# Patient Record
Sex: Female | Born: 1980 | Race: Black or African American | Hispanic: No | Smoking: Never smoker
Health system: Southern US, Community
[De-identification: ages and names within clinical notes are randomized; demographics above are authoritative.]

## PROBLEM LIST (undated history)

## (undated) DIAGNOSIS — E119 Type 2 diabetes mellitus without complications: Secondary | ICD-10-CM

## (undated) DIAGNOSIS — F419 Anxiety disorder, unspecified: Secondary | ICD-10-CM

## (undated) DIAGNOSIS — I10 Essential (primary) hypertension: Secondary | ICD-10-CM

## (undated) DIAGNOSIS — Z973 Presence of spectacles and contact lenses: Secondary | ICD-10-CM

## (undated) DIAGNOSIS — N920 Excessive and frequent menstruation with regular cycle: Secondary | ICD-10-CM

## (undated) HISTORY — DX: Essential (primary) hypertension: I10

## (undated) HISTORY — DX: Anxiety disorder, unspecified: F41.9

## (undated) HISTORY — PX: OTHER SURGICAL HISTORY: SHX169

---

## 2003-10-15 ENCOUNTER — Ambulatory Visit (HOSPITAL_COMMUNITY): Admission: RE | Admit: 2003-10-15 | Discharge: 2003-10-15 | Payer: Self-pay | Admitting: Family Medicine

## 2003-11-12 ENCOUNTER — Emergency Department (HOSPITAL_COMMUNITY): Admission: EM | Admit: 2003-11-12 | Discharge: 2003-11-12 | Payer: Self-pay | Admitting: Emergency Medicine

## 2005-05-03 ENCOUNTER — Emergency Department (HOSPITAL_COMMUNITY): Admission: EM | Admit: 2005-05-03 | Discharge: 2005-05-03 | Payer: Self-pay | Admitting: Emergency Medicine

## 2005-05-06 ENCOUNTER — Ambulatory Visit (HOSPITAL_COMMUNITY): Admission: RE | Admit: 2005-05-06 | Discharge: 2005-05-06 | Payer: Self-pay | Admitting: Orthopedic Surgery

## 2005-05-08 ENCOUNTER — Encounter: Admission: RE | Admit: 2005-05-08 | Discharge: 2005-06-06 | Payer: Self-pay | Admitting: Orthopedic Surgery

## 2005-07-30 ENCOUNTER — Ambulatory Visit (HOSPITAL_BASED_OUTPATIENT_CLINIC_OR_DEPARTMENT_OTHER): Admission: RE | Admit: 2005-07-30 | Discharge: 2005-07-31 | Payer: Self-pay | Admitting: Orthopedic Surgery

## 2007-11-18 ENCOUNTER — Emergency Department (HOSPITAL_COMMUNITY): Admission: EM | Admit: 2007-11-18 | Discharge: 2007-11-18 | Payer: Self-pay | Admitting: Family Medicine

## 2008-02-13 ENCOUNTER — Emergency Department (HOSPITAL_COMMUNITY): Admission: EM | Admit: 2008-02-13 | Discharge: 2008-02-13 | Payer: Self-pay | Admitting: Emergency Medicine

## 2010-05-28 LAB — DIFFERENTIAL
Basophils Absolute: 0.1 10*3/uL (ref 0.0–0.1)
Basophils Relative: 1 % (ref 0–1)
Eosinophils Absolute: 0.3 10*3/uL (ref 0.0–0.7)
Eosinophils Relative: 5 % (ref 0–5)
Lymphocytes Relative: 47 % — ABNORMAL HIGH (ref 12–46)
Lymphs Abs: 2.6 10*3/uL (ref 0.7–4.0)
Monocytes Absolute: 0.3 10*3/uL (ref 0.1–1.0)
Monocytes Relative: 6 % (ref 3–12)
Neutro Abs: 2.3 10*3/uL (ref 1.7–7.7)
Neutrophils Relative %: 41 % — ABNORMAL LOW (ref 43–77)

## 2010-05-28 LAB — URINALYSIS, ROUTINE W REFLEX MICROSCOPIC
Glucose, UA: NEGATIVE mg/dL
Hgb urine dipstick: NEGATIVE
Ketones, ur: 15 mg/dL — AB
Nitrite: NEGATIVE
Protein, ur: NEGATIVE mg/dL
Specific Gravity, Urine: 1.03 (ref 1.005–1.030)
Urobilinogen, UA: 1 mg/dL (ref 0.0–1.0)
pH: 6 (ref 5.0–8.0)

## 2010-05-28 LAB — CBC
HCT: 41 % (ref 36.0–46.0)
Hemoglobin: 13.2 g/dL (ref 12.0–15.0)
MCHC: 32.2 g/dL (ref 30.0–36.0)
MCV: 81.9 fL (ref 78.0–100.0)
Platelets: 272 10*3/uL (ref 150–400)
RBC: 5.01 MIL/uL (ref 3.87–5.11)
RDW: 13.9 % (ref 11.5–15.5)
WBC: 5.6 10*3/uL (ref 4.0–10.5)

## 2010-05-28 LAB — WET PREP, GENITAL
Clue Cells Wet Prep HPF POC: NONE SEEN
Trich, Wet Prep: NONE SEEN
WBC, Wet Prep HPF POC: NONE SEEN
Yeast Wet Prep HPF POC: NONE SEEN

## 2010-05-28 LAB — GC/CHLAMYDIA PROBE AMP, GENITAL
Chlamydia, DNA Probe: NEGATIVE
GC Probe Amp, Genital: NEGATIVE

## 2010-05-28 LAB — HCG, QUANTITATIVE, PREGNANCY: hCG, Beta Chain, Quant, S: <2 m[IU]/mL (ref <5)

## 2010-05-28 LAB — POCT PREGNANCY, URINE: Preg Test, Ur: NEGATIVE

## 2010-05-28 LAB — RPR: RPR Ser Ql: NONREACTIVE

## 2010-06-27 HISTORY — PX: KNEE ARTHROSCOPY W/ ACL RECONSTRUCTION: SHX1858

## 2010-06-29 NOTE — Op Note (Signed)
NAMEFLARA, STORTI                 ACCOUNT NO.:  [id number]   MEDICAL RECORD NO.:  [id number]          PATIENT TYPE:  AMB   LOCATION:  DSC                          FACILITY:  [facility]   PHYSICIAN:  Deidre Ala, M.D.    DATE OF BIRTH:  1980-03-29   DATE OF PROCEDURE:  07/30/2005  DATE OF DISCHARGE:                                 OPERATIVE REPORT   PREOPERATIVE DIAGNOSIS:  Right knee anterior cruciate ligament tear,  complete, rule out meniscal tear.   POSTOPERATIVE DIAGNOSES:  1.  Right knee complete anterior cruciate ligament tear.  2.  Right posterior medial meniscal tear, bucket handle.   OPERATION:  1.  Right knee operative arthroscopy with endoscopically assisted anterior      cruciate ligament reconstruction using allograft/patellar tendon bone      with internal fixation.  2.  Posterior medial meniscectomy.  3.  Use of intraoperative flora.   SURGEON:  1.  Charlesetta Shanks, M.D.   ASSISTANT:  Clarene Reamer, P.A.-C.   ANESTHESIA:  General with LMA.   CULTURES:  None.   DRAINS:  None.   ESTIMATED BLOOD LOSS:  Minimal.   TOURNIQUET TIME:  Sixty minutes.   PATHOLOGIC FINDINGS/HISTORY:  Ladene was sent by [facility].  She had seen  Dr. Althea Charon before with ACL tear, complete, probable posterior medial  meniscus tear.  She elected to not have anything done, but she reinjured her  knee on May 06, 2005.  A new MRI scan showed a questionable oblique  posterior horn lateral meniscus tear and an ACL completely torn.  At this  time, she had positive signs and elected to proceed with surgical  intervention.  At surgery, she did have a complete ACL tear.  There was a  posterior medial meniscus tear, bucket handle type, that we debrided back to  a stable rim.  The lateral meniscus appeared stable to [place], perhaps a  slightly enlarged popliteal hiatus.  The joint surfaces looked good. There  were plicas.  At closure, we had used patellar tendon allograft with a 15 mm  tendon strip  tubed down to a 10 with 10 mm x 30 plugs, proximal and distal.  We used a 9 x 30 Kurosaka screw proximal and a 9 x 25 distal with excellent  isometry and tight Lachman and anterior drawer at closure with clearance of  the notch in full extension.   PROCEDURE:  With adequate anesthesia obtained using LMA technique, 1 gm of  Ancef given IV prophylaxis, the patient was placed in a supine position.  The right lower extremity was prepped from the malleoli to the tourniquet in  a standard fashion.  The patient was then placed in a __________ knee holder  after a standard prepping and draping.  A superolateral inflow portal was  made.  The knee was insufflated with normal saline with the arthroscopic  pump.  Medial and lateral scope portals were then made, and the joint was  thoroughly inspected.  We then shaved out the medial plica back to the side  wall and lysed the medial band.  I then probed the medial meniscus, found  the tear, used meniscus scissors to cut the front and back of the  displaceable tear, and then saucerized it back to a stable rim with shaver  and basket.  Ablator was used to smooth.  Portals reversed.  I checked the  posterior lateral meniscus, which was intact.  The ACL was then shaved out  its remnant.  I then did a notchplasty with shaver, 4.0 burr, and ablator.  The [employer] guide was then placed on 55 with an incision just medial to the  tubercle.  I then placed the guidepin up just anterior to the posterior  cruciate, which was intact.  I then placed the knee in about -35 degrees of  full extension, and using the C-arm, placed the femoral guide and placed the  guidepin back against the posterior cortex of the distal femur.  We had  reamed 10 mm on the tibial side.  We reamed 10 mm on the femoral side in 35  deep.  At the back table, Clarene Reamer had prepared the graft, as above.  We then placed the two pin passer up and out the distal thigh along with an  incision  made just inferior to the inferior pole of the patella with the  Kurosaka screw guide pin at 12 o'clock and up and out.  We then passed the  graft up into the femoral tunnel until it was snug at the appropriate mark  line with the cortical side up anterior and the cancellous side posterior.  We then under fluoroscopy flexed the knee and straightened out the Kurosaka  guide pin and with the graft protector in place, the 9 x 30 Kurosaka screw  with a squeaky tight fit on top of the graft on the femur.  We then, with  the knee in 45 degrees of flexion, with posterior drawer and pulling  distally on the graft, placed the guidepin anteriorly and placed the 9 x 25  mm Kurosaka screw with squeaky tight fit.  The passing sutures and the wire  distally on the graft were removed as well as the guidepin.  C-arm  fluoroscopy confirmed position on AP and lateral view.  The wounds were  irrigated and closed with 2-0 and 3-0 Vicryl on the subcu with running 4-0  Monocryl and Steri-Strips.  The portals were left open.  A bulky sterile  compressive dressing was applied with knee immobilizer.  The patient, having  tolerated the procedure well, was awakened and taken to the recovery room in  a satisfactory condition.  He will be discharged to outpatient routine.  Crutches and weightbearing as tolerated.  He will be admitted if he has need  for increased pain control but otherwise will go home with CPM.  He will  come back in 3-4 days for postoperative stent, given Percocet for pain.           ______________________________  V. Charlesetta Shanks, M.D.     VEP/MEDQ  D:  07/30/2005  T:  07/30/2005  Job:  [id number]

## 2010-11-12 LAB — POCT PREGNANCY, URINE: Preg Test, Ur: NEGATIVE

## 2011-11-08 ENCOUNTER — Emergency Department (HOSPITAL_COMMUNITY): Payer: Self-pay

## 2011-11-08 ENCOUNTER — Encounter (HOSPITAL_COMMUNITY): Payer: Self-pay

## 2011-11-08 ENCOUNTER — Emergency Department (HOSPITAL_COMMUNITY)
Admission: EM | Admit: 2011-11-08 | Discharge: 2011-11-08 | Disposition: A | Discharge status: Home / Self Care | Payer: Self-pay | Attending: Emergency Medicine | Admitting: Emergency Medicine

## 2011-11-08 DIAGNOSIS — I10 Essential (primary) hypertension: Secondary | ICD-10-CM | POA: Insufficient documentation

## 2011-11-08 DIAGNOSIS — R079 Chest pain, unspecified: Secondary | ICD-10-CM | POA: Insufficient documentation

## 2011-11-08 DIAGNOSIS — R0602 Shortness of breath: Secondary | ICD-10-CM | POA: Insufficient documentation

## 2011-11-08 LAB — COMPREHENSIVE METABOLIC PANEL
ALT: 19 U/L (ref 0–35)
AST: 23 U/L (ref 0–37)
Albumin: 4.2 g/dL (ref 3.5–5.2)
Alkaline Phosphatase: 54 U/L (ref 39–117)
BUN: 10 mg/dL (ref 6–23)
CO2: 28 mEq/L (ref 19–32)
Calcium: 9.9 mg/dL (ref 8.4–10.5)
Chloride: 101 mEq/L (ref 96–112)
Creatinine, Ser: 0.94 mg/dL (ref 0.50–1.10)
GFR calc Af Amer: >90 mL/min (ref >90)
GFR calc non Af Amer: 82 mL/min — ABNORMAL LOW (ref >90)
Glucose, Bld: 102 mg/dL — ABNORMAL HIGH (ref 70–99)
Potassium: 3.4 mEq/L — ABNORMAL LOW (ref 3.5–5.1)
Sodium: 140 mEq/L (ref 135–145)
Total Bilirubin: 0.2 mg/dL — ABNORMAL LOW (ref 0.3–1.2)
Total Protein: 7.6 g/dL (ref 6.0–8.3)

## 2011-11-08 LAB — CBC WITH DIFFERENTIAL/PLATELET
Basophils Absolute: 0 10*3/uL (ref 0.0–0.1)
Basophils Relative: 1 % (ref 0–1)
Eosinophils Absolute: 0.3 10*3/uL (ref 0.0–0.7)
Eosinophils Relative: 5 % (ref 0–5)
HCT: 40.7 % (ref 36.0–46.0)
Hemoglobin: 13.4 g/dL (ref 12.0–15.0)
Lymphocytes Relative: 52 % — ABNORMAL HIGH (ref 12–46)
Lymphs Abs: 3.2 10*3/uL (ref 0.7–4.0)
MCH: 25.9 pg — ABNORMAL LOW (ref 26.0–34.0)
MCHC: 32.9 g/dL (ref 30.0–36.0)
MCV: 78.6 fL (ref 78.0–100.0)
Monocytes Absolute: 0.5 10*3/uL (ref 0.1–1.0)
Monocytes Relative: 8 % (ref 3–12)
Neutro Abs: 2.1 10*3/uL (ref 1.7–7.7)
Neutrophils Relative %: 34 % — ABNORMAL LOW (ref 43–77)
Platelets: 249 10*3/uL (ref 150–400)
RBC: 5.18 MIL/uL — ABNORMAL HIGH (ref 3.87–5.11)
RDW: 13.7 % (ref 11.5–15.5)
WBC: 6.2 10*3/uL (ref 4.0–10.5)

## 2011-11-08 LAB — TROPONIN I: Troponin I: <0.3 ng/mL (ref <0.30)

## 2011-11-08 LAB — POCT PREGNANCY, URINE: Preg Test, Ur: NEGATIVE

## 2011-11-08 MED ORDER — POTASSIUM CHLORIDE CRYS ER 20 MEQ PO TBCR
40.0000 meq | EXTENDED_RELEASE_TABLET | Freq: Once | ORAL | Status: AC
  Administered 2011-11-08: 40 meq via ORAL
  Filled 2011-11-08: qty 2

## 2011-11-08 MED ORDER — LISINOPRIL 10 MG PO TABS: 10.0000 mg | ORAL_TABLET | Freq: Every day | ORAL | Status: DC

## 2011-11-08 MED ORDER — SODIUM CHLORIDE 0.9 % IV BOLUS (SEPSIS)
1000.0000 mL | Freq: Once | INTRAVENOUS | Status: AC
  Administered 2011-11-08: 1000 mL via INTRAVENOUS

## 2011-11-08 MED ORDER — LISINOPRIL 10 MG PO TABS
10.0000 mg | ORAL_TABLET | Freq: Once | ORAL | Status: AC
  Administered 2011-11-08: 10 mg via ORAL
  Filled 2011-11-08: qty 1

## 2011-11-08 NOTE — ED Notes (Signed)
Returned from xray

## 2011-11-08 NOTE — ED Provider Notes (Signed)
History     CSN: [id number]  Arrival date & time 11/08/11  0030   First MD Initiated Contact with Patient 11/08/11 0050      Chief Complaint  Patient presents with  . Chest Pain   HPI  History provided by the patient. Patient is a 31 year old female with no significant PMH who presents with complaints of shortness of breath and chest pressure. Patient states that symptoms first began earlier today around 1 PM with increase shortness of breath and fatigue with exertion while at work. 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 Patient also reports with developing a chest heaviness and pressure. She also has some burning sensation. Symptoms did seem slightly worse after eating a meal late tonight around 8 PM. Currently patient still reports some chest discomfort. She denies any shortness of breath at rest. Patient denies any recent long travel, recent surgery, prior history of cancer, hx of DVT or PE, estrogen use or hemoptysis.    History reviewed. No pertinent past medical history.  Past Surgical History  Procedure Date  . Right acl reconstruction   . Cesarean section     No family history on file.  History  Substance Use Topics  . Smoking status: Never Smoker   . Smokeless tobacco: Not on file  . Alcohol Use: No    OB History    Grav Para Term Preterm Abortions TAB SAB Ect Mult Living                  Review of Systems  Constitutional: Negative for fever and chills.  Eyes: Negative for visual disturbance.  Respiratory: Positive for shortness of breath. Negative for cough.   Cardiovascular: Positive for chest pain. Negative for palpitations and leg swelling.  Gastrointestinal: Negative for nausea, vomiting, abdominal pain, diarrhea and constipation.  Genitourinary: Negative for dysuria, frequency, hematuria and flank pain.  Skin: Negative for rash.  Neurological: Negative for dizziness, syncope,  light-headedness and headaches.    Allergies  Review of patient's allergies indicates no known allergies.  Home Medications  No current outpatient prescriptions on file.  BP 171/109  Pulse 77  Temp 98.1 F (36.7 C)  Resp 17  SpO2 100%  LMP 09/07/2011  Physical Exam  Nursing note and vitals reviewed. Constitutional: She is oriented to person, place, and time. She appears well-developed and well-nourished. No distress.  HENT:  Head: Normocephalic and atraumatic.  Cardiovascular: Normal rate and regular rhythm.   No murmur heard. Pulmonary/Chest: Effort normal and breath sounds normal. No respiratory distress. She has no wheezes. She has no rales.  Abdominal: Soft. There is no tenderness. There is no rebound and no guarding.       No CVA tenderness  Musculoskeletal: Normal range of motion. She exhibits no edema and no tenderness.       No clinical signs for DVT  Neurological: She is alert and oriented to person, place, and time.  Skin: Skin is warm and dry.  Psychiatric: She has a normal mood and affect. Her behavior is normal.    ED Course  Procedures   Results for orders placed during the hospital encounter of 11/08/11  CBC WITH DIFFERENTIAL      Component Value Range   WBC 6.2  4.0 - 10.5 K/uL   RBC 5.18 (*) 3.87 - 5.11 MIL/uL   Hemoglobin 13.4  12.0 - 15.0 g/dL   HCT 78.2  95.6 - 21.3 %  MCV 78.6  78.0 - 100.0 fL   MCH 25.9 (*) 26.0 - 34.0 pg   MCHC 32.9  30.0 - 36.0 g/dL   RDW 82.9  56.2 - 13.0 %   Platelets 249  150 - 400 K/uL   Neutrophils Relative 34 (*) 43 - 77 %   Neutro Abs 2.1  1.7 - 7.7 K/uL   Lymphocytes Relative 52 (*) 12 - 46 %   Lymphs Abs 3.2  0.7 - 4.0 K/uL   Monocytes Relative 8  3 - 12 %   Monocytes Absolute 0.5  0.1 - 1.0 K/uL   Eosinophils Relative 5  0 - 5 %   Eosinophils Absolute 0.3  0.0 - 0.7 K/uL   Basophils Relative 1  0 - 1 %   Basophils Absolute 0.0  0.0 - 0.1 K/uL  COMPREHENSIVE METABOLIC PANEL      Component Value Range    Sodium 140  135 - 145 mEq/L   Potassium 3.4 (*) 3.5 - 5.1 mEq/L   Chloride 101  96 - 112 mEq/L   CO2 28  19 - 32 mEq/L   Glucose, Bld 102 (*) 70 - 99 mg/dL   BUN 10  6 - 23 mg/dL   Creatinine, Ser 8.65  0.50 - 1.10 mg/dL   Calcium 9.9  8.4 - 78.4 mg/dL   Total Protein 7.6  6.0 - 8.3 g/dL   Albumin 4.2  3.5 - 5.2 g/dL   AST 23  0 - 37 U/L   ALT 19  0 - 35 U/L   Alkaline Phosphatase 54  39 - 117 U/L   Total Bilirubin 0.2 (*) 0.3 - 1.2 mg/dL   GFR calc non Af Amer 82 (*) >90 mL/min   GFR calc Af Amer >90  >90 mL/min  TROPONIN I      Component Value Range   Troponin I <0.30  <0.30 ng/mL  POCT PREGNANCY, URINE      Component Value Range   Preg Test, Ur NEGATIVE  NEGATIVE      Dg Chest 2 View  11/08/2011  *RADIOLOGY REPORT*  Clinical Data: Chest pain.  CHEST - 2 VIEW  Comparison: None.  Findings:  Cardiopericardial silhouette within normal limits. Mediastinal contours normal. Trachea midline.  No airspace disease or effusion. Monitoring leads are projected over the chest.  IMPRESSION: Negative two-view chest.   Original Report Authenticated By: Andreas Newport, M.D.      1. Hypertension   2. Shortness of breath       MDM  1:00AM patient seen and evaluated. Patient in no acute distress and appears well. Recheck of blood pressure performed by me showed 197/133 in right arm with regular cuff.   Patient discussed with physician. Patient no significant past medical history. She is PERC negative. Patient has unremarkable ECG. No signs of LVH. Chest x-ray also unremarkable with no signs of enlarged heart. He should also with negative troponin. Labs are unremarkable. At this time do not suspect any hypertensive crisis or urgency. Patient has not seemed to have any endorgan damage.  I discussed findings with patient. Will start patient on low-dose lisinopril to be followed with PCP next week. Patient agrees and will begin checking blood pressures regularly.       Date: 11/08/2011   Rate: 80  Rhythm: normal sinus rhythm  QRS Axis: normal  Intervals: normal  ST/T Wave abnormalities: normal  Conduction Disutrbances:none  Narrative Interpretation:   Old EKG Reviewed: none available  Angus Seller, [place] 11/08/11 [phone]

## 2011-11-08 NOTE — ED Provider Notes (Signed)
Medical screening examination/treatment/procedure(s) were performed by non-physician practitioner and as supervising physician I was immediately available for consultation/collaboration.  Shaunice Levitan, MD 11/08/11 0801 

## 2011-11-08 NOTE — ED Notes (Signed)
Pt states she has been having burning and pressure to her upper chest since she was at work Thursday at 1300.  States she did not eat all day and then she ate pepperoni and cheese on a bagel at 2000 last night.  States she felt some shortness of breath earlier in the day on Thursday.  Pt denies hx HTN-states family hx HTN.

## 2013-05-22 ENCOUNTER — Emergency Department (HOSPITAL_COMMUNITY)
Admission: EM | Admit: 2013-05-22 | Discharge: 2013-05-22 | Disposition: A | Discharge status: Home / Self Care | Payer: BC Managed Care – PPO | Source: Home / Self Care | Attending: Family Medicine | Admitting: Family Medicine

## 2013-05-22 ENCOUNTER — Encounter (HOSPITAL_COMMUNITY): Payer: Self-pay | Admitting: Emergency Medicine

## 2013-05-22 DIAGNOSIS — M7050 Other bursitis of knee, unspecified knee: Secondary | ICD-10-CM

## 2013-05-22 DIAGNOSIS — IMO0002 Reserved for concepts with insufficient information to code with codable children: Secondary | ICD-10-CM

## 2013-05-22 MED ORDER — PREDNISONE 10 MG PO KIT: PACK | ORAL | Status: DC

## 2013-05-22 NOTE — ED Provider Notes (Signed)
[name] is a 33 y.o. female who presents to Urgent Care today for right knee pain. Patient has had worsening right medial knee pain for the past several days. She denies any recent injury. She has increased her exercises recently. Pain is worse with activity better with rest. She's tried ibuprofen which has helped some. No radiating pain weakness or numbness.   History reviewed. No pertinent past medical history. History  Substance Use Topics  . Smoking status: Never Smoker   . Smokeless tobacco: Not on file  . Alcohol Use: No   ROS as above Medications: No current facility-administered medications for this encounter.   Current Outpatient Prescriptions  Medication Sig Dispense Refill  . aspirin 81 MG chewable tablet Chew 81 mg by mouth once.      Marland Kitchen lisinopril (PRINIVIL,ZESTRIL) 10 MG tablet Take 1 tablet (10 mg total) by mouth daily.  20 tablet  0  . PredniSONE 10 MG KIT 12 day dose pack po  1 kit  0    Exam:  BP 153/107  Pulse 67  Temp(Src) 98.3 F (36.8 C) (Oral)  Resp 16  SpO2 100% Gen: Well NAD Right knee: No effusion noted Full range of motion Tender palpation pes anserine bursa and medial joint line.  Stable edematous exam Positive medial McMurray's test Capillary refill sensation and capillary refill are intact bilateral lower extremities.  Limited musculoskeletal ultrasound of the right knee.  Quad tendon is intact with mild effusion present. Patella tendon is intact with slight hypoechoic change in the mid tendon substance consistent with patellar tendinitis. No increased vascular activity.  Medial joint line is normal-appearing no tears meniscus or visible.  Lateral joint line is normal-appearing.  Hypoechoic fluid seen underneath the tendon structures and the location of the pes anserine bursa. Tender to touch. Consistent with pes anserine bursitis  No results found for this or any previous visit (from the past 24 hour(s)). No results found.  Assessment  and Plan: 33 y.o. female with pes anserine bursitis. Possible medial meniscus tear. Plan to treat with prednisone and home exercise program.  Followup with sports medicine if not improving.  Discussed warning signs or symptoms. Please see discharge instructions. Patient expresses understanding.    Gregor Hams, MD 05/22/13 2015

## 2013-05-22 NOTE — ED Notes (Signed)
Pain  And  Swelling  Of the  r  Knee          X  2  Days   denys  Recent  Injury  But  Has  Had  Problems   With    That  Knee  In  Past

## 2013-05-22 NOTE — Discharge Instructions (Signed)
Thank you for coming in today. Use prednisone daily for 12 days. Apply a bag of frozen peas for about 10 minutes 3 times daily. Followup with Dr. Althea Charon at [facility] if not getting better.  Pes Anserinus Syndrome with Rehab The pes anserine, also known as the goose's foot, is an area of the shinbone (tibia) near the knee joint where the tendons of three of the muscles of the thigh insert into the bone. These muscles are important for bending the knee and bringing the leg across the body. Just underneath the three tendons that attach at the pes anserinus exists a fluid filled sac (bursa) that is meant to reduce the friction between the tendons and the tibia. Pes anserinus syndrome is a condition that is characterized by inflammation of the bursa (bursitis) and/ or tendonitis (inflammation of the tendon) and may cause severe pain in the lower portion of the inner (medial) side of the knee. SYMPTOMS   Pain and inflammation over the lower portion of the medial side of the knee.  Pain that worsens as the duration of an activity increases.  Pain that worsens when bending the knee, especially against resistance.  A crackling sound (crepitation) when the tendon or bursa is moved or touched. CAUSES  Bursitis and tendonitis are usually characterized as overuse injuries. Common mechanisms of injury include:  Stress placed on the knee from a sudden increase in the intensity, frequency, or duration of training.  Direct trauma to the upper leg (less common). RISK INCREASES WITH:  Endurance sports (distance running or triathletes).  Making changes to or beginning a new training program.  Sports that place stress on the muscles that insert at the pes anserinus, such as those that require pivoting, cutting, or jumping.  Improper training.  Poor strength and flexibility  Failure to warm-up properly before activity.  Improper knee alignment ( knock knees).  Arthritis of the  knee. PREVENTION  Warm up and stretch properly before activity.  Allow for adequate recovery between workouts.  Maintain physical fitness:  Strength, flexibility, and endurance.  Cardiovascular fitness.  Learn and use training methods that will reduce the stress placed on the pes anserinus.  Arch supports (orthotics) may be helpful for those with flat feet. PROGNOSIS  If treated properly, then the symptoms of pes anserinus syndrome usually resolve within 6 weeks.  RELATED COMPLICATIONS   Persistent and potentially chronic pain if the condition is not treated properly.  Re-injury if activity is resumed before the injury is allowed to heal completely, or if one resumes improper training habits. TREATMENT Treatment initially involves the use of ice and medication to help reduce pain and inflammation. The use of strengthening and stretching exercises may help reduce pain with activity. These exercises may be performed at home or with a therapist. Individuals who have flat feet may find benefit in wearing arch supports in their shoes. Some individuals find that compression bandages or knee sleeves help reduce symptoms. Your caregiver may recommend a corticosteroid injection to help reduce inflammation. If symptoms persist, despite conservative treatment for greater than 6 months, then surgery may be recommended.  MEDICATION   If pain medication is necessary, then nonsteroidal anti-inflammatory medications, such as aspirin and ibuprofen, or other minor pain relievers, such as acetaminophen, are often recommended.  Do not take pain medication for 7 days before surgery.  Prescription pain relievers may be given if deemed necessary by your caregiver. Use only as directed and only as much as you need.  Corticosteroid injections  may be given by your caregiver. These injections should be reserved for the most serious cases, because they may only be given a certain number of times. SEEK MEDICAL  CARE IF:  Treatment seems to offer no benefit, or the condition worsens.  Any medications produce adverse side effects. EXERCISES  RANGE OF MOTION (ROM) AND STRETCHING EXERCISES - Pes Anserinus Syndrome These exercises may help you when beginning to rehabilitate your injury. Your symptoms may resolve with or without further involvement from your physician, physical therapist or athletic trainer. While completing these exercises, remember:   Restoring tissue flexibility helps normal motion to return to the joints. This allows healthier, less painful movement and activity.  An effective stretch should be held for at least 30 seconds.  A stretch should never be painful. You should only feel a gentle lengthening or release in the stretched tissue. STRETCH  Hamstrings, Supine  Lie on your back. Loop a belt or towel over the ball of your right / left foot.  Straighten your right / left knee and slowly pull on the belt to raise your leg. Do not allow the right / left knee to bend. Keep your opposite leg flat on the floor.  Raise the leg until you feel a gentle stretch behind your right / left knee or thigh. Hold this position for __________ seconds. Repeat __________ times. Complete this stretch __________ times per day.  STRETCH - Hamstrings, Doorway  Lie on your back with your right / left leg extended and resting on the wall and the opposite leg flat on the ground through the door. Initially, position your bottom farther away from the wall than the illustration shows.  Keep your right / left knee straight. If you feel a stretch behind your knee or thigh, hold this position for __________ seconds.  If you do not feel a stretch, scoot your bottom closer to the door, and hold __________ seconds. Repeat __________ times. Complete this stretch __________ times per day.  STRETCH - Hamstrings/Adductors, V-Sit  Sit on the floor with your legs extended in a large "V," keeping your knees  straight.  With your head and chest upright, bend at your waist reaching for your right foot to stretch your left adductors.  You should feel a stretch in your left inner thigh. Hold for __________ seconds.  Return to the upright position to relax your leg muscles.  Continuing to keep your chest upright, bend straight forward at your waist to stretch your hamstrings.  You should feel a stretch behind both of your thighs and/or knees. Hold for __________ seconds.  Return to the upright position to relax your leg muscles.  Repeat steps 2 through 4. Repeat __________ times. Complete this exercise __________ times per day.  STRETCH - Hamstrings, Standing  Stand or sit and extend your right / left leg, placing your foot on a chair or foot stool  Keeping a slight arch in your low back and your hips straight forward.  Lead with your chest and lean forward at the waist until you feel a gentle stretch in the back of your right / left knee or thigh. (When done correctly, this exercise requires leaning only a small distance.)  Hold this position for __________ seconds. Repeat __________ times. Complete this stretch __________ times per day. STRETCH - Adductors, Lunge  While standing, spread your legs  Lean away from your right / left leg by bending your opposite knee. You may rest your hands on your thigh for balance.  You should feel a stretch in your right / left inner thigh. Hold for __________ seconds. Repeat __________ times. Complete this exercise __________ times per day.  STRETCH - Adductors, Standing  Place your right / left foot on a counter or stable table. Turn away from your leg so both hips line up with your right / left leg.  Keeping your hips facing forward, slowly bend your opposite leg until you feel a gentle stretch on the inside of your right / left thigh.  Hold for __________ seconds. Repeat __________ times. Complete this exercise __________ times per day.   STRENGTHENING EXERCISES - Pes Anserinus Syndrome  These exercises may help you when beginning to rehabilitate your injury. They may resolve your symptoms with or without further involvement from your physician, physical therapist or athletic trainer. While completing these exercises, remember:   Muscles can gain both the endurance and the strength needed for everyday activities through controlled exercises.  Complete these exercises as instructed by your physician, physical therapist or athletic trainer. Progress the resistance and repetitions only as guided. STRENGTH - Hamstring, Curls  Lay on your stomach with your legs extended. (If you lay on a bed, your feet may hang over the edge.)  Tighten the muscles in the back of your thigh to bend your right / left knee up to 90 degrees. Keep your hips flat on the bed/floor.  Hold this position for __________ seconds.  Slowly lower your leg back to the starting position. Repeat __________ times. Complete this exercise __________ times per day.  OPTIONAL ANKLE WEIGHTS: Begin with ____________________, but DO NOT exceed ____________________. Increase in 1 lb/0.5 kg increments.  STRENGTH - Hip Adductors, Straight Leg Raises  Lie on your side so that your head, shoulders, knee and hip line up. You may place your upper foot in front to help maintain your balance. Your right / left leg should be on the bottom.  Roll your hips slightly forward, so that your hips are stacked directly over each other and your right / left knee is facing forward.  Tense the muscles in your inner thigh and lift your bottom leg 4-6 inches. Hold this position for __________ seconds.  Slowly lower your leg to the starting position. Allow the muscles to fully relax before beginning the next repetition. Repeat __________ times. Complete this exercise __________ times per day.  Document Released: 01/28/2005 Document Revised: 04/22/2011 Document Reviewed: 05/12/2008 [facility]  Patient Information 2014 [place] [employer], [place].

## 2013-06-07 ENCOUNTER — Telehealth: Payer: Self-pay

## 2013-06-07 NOTE — Telephone Encounter (Signed)
Left message for call back Non-identifiable   NEW PATIENT 

## 2013-06-09 ENCOUNTER — Ambulatory Visit: Payer: BC Managed Care – PPO | Admitting: Family Medicine

## 2013-06-09 DIAGNOSIS — Z0289 Encounter for other administrative examinations: Secondary | ICD-10-CM

## 2013-06-09 NOTE — Telephone Encounter (Signed)
Still unable to reach patient. Left another message for call back.

## 2013-06-14 NOTE — Telephone Encounter (Signed)
No show

## 2014-01-04 ENCOUNTER — Ambulatory Visit (INDEPENDENT_AMBULATORY_CARE_PROVIDER_SITE_OTHER): Payer: Managed Care, Other (non HMO) | Admitting: Family Medicine

## 2014-01-04 VITALS — BP 138/92 | HR 82 | Temp 98.3°F | Resp 16 | Ht 67.0 in | Wt 214.4 lb

## 2014-01-04 DIAGNOSIS — I10 Essential (primary) hypertension: Secondary | ICD-10-CM

## 2014-01-04 DIAGNOSIS — R739 Hyperglycemia, unspecified: Secondary | ICD-10-CM

## 2014-01-04 DIAGNOSIS — B372 Candidiasis of skin and nail: Secondary | ICD-10-CM

## 2014-01-04 DIAGNOSIS — F411 Generalized anxiety disorder: Secondary | ICD-10-CM

## 2014-01-04 DIAGNOSIS — R5383 Other fatigue: Secondary | ICD-10-CM

## 2014-01-04 LAB — POCT CBC
Granulocyte percent: 51.9 % (ref 37–80)
HCT, POC: 40.5 % (ref 37.7–47.9)
Hemoglobin: 12.8 g/dL (ref 12.2–16.2)
Lymph, poc: 3.2 (ref 0.6–3.4)
MCH, POC: 25.5 pg — AB (ref 27–31.2)
MCHC: 31.7 g/dL — AB (ref 31.8–35.4)
MCV: 80.4 fL (ref 80–97)
MID (cbc): 0.2 (ref 0–0.9)
MPV: 9.2 fL (ref 0–99.8)
POC Granulocyte: 3.7 (ref 2–6.9)
POC LYMPH PERCENT: 45.1 %L (ref 10–50)
POC MID %: 3 %M (ref 0–12)
Platelet Count, POC: 251 10*3/uL (ref 142–424)
RBC: 5.03 M/uL (ref 4.04–5.48)
RDW, POC: 14.4 %
WBC: 7.2 10*3/uL (ref 4.6–10.2)

## 2014-01-04 LAB — GLUCOSE, POCT (MANUAL RESULT ENTRY): POC Glucose: 79 mg/dl (ref 70–99)

## 2014-01-04 LAB — POCT GLYCOSYLATED HEMOGLOBIN (HGB A1C): Hemoglobin A1C: 5.7

## 2014-01-04 MED ORDER — CITALOPRAM HYDROBROMIDE 10 MG PO TABS: 10.0000 mg | ORAL_TABLET | Freq: Every day | ORAL | Status: DC

## 2014-01-04 MED ORDER — METFORMIN HCL 500 MG PO TABS: 500.0000 mg | ORAL_TABLET | Freq: Every day | ORAL | Status: DC

## 2014-01-04 MED ORDER — LISINOPRIL-HYDROCHLOROTHIAZIDE 20-12.5 MG PO TABS: 1.0000 | ORAL_TABLET | Freq: Every day | ORAL | Status: DC

## 2014-01-04 MED ORDER — NYSTATIN 100000 UNIT/GM EX POWD: CUTANEOUS | Status: DC

## 2014-01-04 NOTE — Progress Notes (Signed)
Chief Complaint:  Chief Complaint  Patient presents with  . Medication Refill    HPI: [name] is a 33 y.o. female who is here for   1 week historyof feeling groggy, she  Is on metformin and has has been taking it for weight lost, no hypoglycemia that she knows of The celexa and HTN meds she ahs been taking for awhile, she is here because she has been for last  2 weeks out of BP meds Celexa she ahs been out for the last  3 weeks , she states she does nto feel normal, she feels she is trulous inside.  She has lower back pain, She is not focusing , Has had tremors and tremulous voice,  she has had HA She denies eye pain or vision, n/svabd pain, confusion or SI.HI.halluciantion  PCP Horald Pollen MD-But can't find her.  Past Medical History  Diagnosis Date  . Anxiety   . Hypertension    Past Surgical History  Procedure Laterality Date  . Right acl reconstruction    . Cesarean section     History   Social History  . Marital Status: Single    Spouse Name: N/A    Number of Children: N/A  . Years of Education: N/A   Social History Main Topics  . Smoking status: Never Smoker   . Smokeless tobacco: None  . Alcohol Use: No  . Drug Use: No  . Sexual Activity: None   Other Topics Concern  . None   Social History Narrative   Family History  Problem Relation Age of Onset  . Hypertension Mother   . Diabetes Father   . Hypertension Father   . Mental illness Father   . Cancer Maternal Grandmother   . Diabetes Maternal Grandmother   . Hypertension Maternal Grandmother   . Stroke Paternal Grandmother    No Known Allergies Prior to Admission medications   Medication Sig Start Date End Date Taking? Authorizing Provider  CITALOPRAM HYDROBROMIDE PO Take 10 mg by mouth.   Yes Historical Provider, MD  lisinopril-hydrochlorothiazide (PRINZIDE,ZESTORETIC) 20-12.5 MG per tablet Take 1 tablet by mouth daily.   Yes Historical Provider, MD  metFORMIN (GLUCOPHAGE) 500 MG  tablet Take 500 mg by mouth 2 (two) times daily with a meal.   Yes Historical Provider, MD  aspirin 81 MG chewable tablet Chew 81 mg by mouth once.    Historical Provider, MD  lisinopril (PRINIVIL,ZESTRIL) 10 MG tablet Take 1 tablet (10 mg total) by mouth daily. Patient not taking: Reported on 01/04/2014 11/08/11   Ruthell Rummage Dammen, PA-C  PredniSONE 10 MG KIT 12 day dose pack po Patient not taking: Reported on 01/04/2014 05/22/13   Gregor Hams, MD     ROS: The patient denies fevers, chills, night sweats, unintentional weight loss, chest pain, palpitations, wheezing, dyspnea on exertion, nausea, vomiting, abdominal pain, dysuria, hematuria, melena, numbness, weakness, or tingling.   All other systems have been reviewed and were otherwise negative with the exception of those mentioned in the HPI and as above.    PHYSICAL EXAM: Filed Vitals:   01/04/14 1618  BP: 138/92  Pulse: 82  Temp: 98.3 F (36.8 C)  Resp: 16   Filed Vitals:   01/04/14 1618  Height: 5' 7"  (1.702 m)  Weight: 214 lb 6.4 oz (97.251 kg)   Body mass index is 33.57 kg/(m^2).  General: Alert, no acute distress HEENT:  Normocephalic, atraumatic, oropharynx patent. EOMI, PERRLA Cardiovascular:  Regular  rate and rhythm, no rubs murmurs or gallops.  No Carotid bruits, radial pulse intact. No pedal edema.  Respiratory: Clear to auscultation bilaterally.  No wheezes, rales, or rhonchi.  No cyanosis, no use of accessory musculature GI: No organomegaly, abdomen is soft and non-tender, positive bowel sounds.  No masses. Skin: No rashes. Neurologic: Facial musculature symmetric. Psychiatric: Patient is appropriate throughout our interaction. Lymphatic: No cervical lymphadenopathy Musculoskeletal: Gait intact.   LABS: Results for orders placed or performed in visit on 01/04/14  POCT CBC  Result Value Ref Range   WBC 7.2 4.6 - 10.2 K/uL   Lymph, poc 3.2 0.6 - 3.4   POC LYMPH PERCENT 45.1 10 - 50 %L   MID (cbc) 0.2 0 - 0.9     POC MID % 3.0 0 - 12 %M   POC Granulocyte 3.7 2 - 6.9   Granulocyte percent 51.9 37 - 80 %G   RBC 5.03 4.04 - 5.48 M/uL   Hemoglobin 12.8 12.2 - 16.2 g/dL   HCT, POC 40.5 37.7 - 47.9 %   MCV 80.4 80 - 97 fL   MCH, POC 25.5 (A) 27 - 31.2 pg   MCHC 31.7 (A) 31.8 - 35.4 g/dL   RDW, POC 14.4 %   Platelet Count, POC 251 142 - 424 K/uL   MPV 9.2 0 - 99.8 fL  POCT glucose (manual entry)  Result Value Ref Range   POC Glucose 79 70 - 99 mg/dl  POCT glycosylated hemoglobin (Hb A1C)  Result Value Ref Range   Hemoglobin A1C 5.7      EKG/XRAY:   Primary read interpreted by Dr. Marin Comment at [facility].   ASSESSMENT/PLAN: Encounter Diagnoses  Name Primary?  . Essential hypertension Yes  . Other fatigue   . Generalized anxiety disorder   . Hyperglycemia   . Candidiasis of skin    Refilled meds I think she may be having SEs from celexa withdrawal Labs pending: TSH and CMP F/u prn  Or in 3 months  Gross sideeffects, risk and benefits, and alternatives of medications d/w patient. Patient is aware that all medications have potential sideeffects and we are unable to predict every sideeffect or drug-drug interaction that may occur.  [name], White, DO 01/04/2014 7:23 PM

## 2014-01-05 LAB — COMPLETE METABOLIC PANEL WITHOUT GFR
ALT: 19 U/L (ref 0–35)
BUN: 8 mg/dL (ref 6–23)
CO2: 27 meq/L (ref 19–32)
Calcium: 9.4 mg/dL (ref 8.4–10.5)
Chloride: 106 meq/L (ref 96–112)
Creat: 0.86 mg/dL (ref 0.50–1.10)
GFR, Est African American: >89 mL/min
GFR, Est Non African American: >89 mL/min
Glucose, Bld: 76 mg/dL (ref 70–99)

## 2014-01-05 LAB — COMPLETE METABOLIC PANEL WITH GFR
AST: 20 U/L (ref 0–37)
Albumin: 4.1 g/dL (ref 3.5–5.2)
Alkaline Phosphatase: 40 U/L (ref 39–117)
Potassium: 4.6 mEq/L (ref 3.5–5.3)
Sodium: 141 mEq/L (ref 135–145)
Total Bilirubin: 0.4 mg/dL (ref 0.2–1.2)
Total Protein: 7.2 g/dL (ref 6.0–8.3)

## 2014-01-05 LAB — TSH: TSH: 1.467 u[IU]/mL (ref 0.350–4.500)

## 2014-06-07 ENCOUNTER — Emergency Department (HOSPITAL_COMMUNITY)
Admission: EM | Admit: 2014-06-07 | Discharge: 2014-06-08 | Disposition: A | Discharge status: Home / Self Care | Payer: Managed Care, Other (non HMO) | Attending: Emergency Medicine | Admitting: Emergency Medicine

## 2014-06-07 ENCOUNTER — Encounter (HOSPITAL_COMMUNITY): Payer: Self-pay | Admitting: *Deleted

## 2014-06-07 DIAGNOSIS — R112 Nausea with vomiting, unspecified: Secondary | ICD-10-CM | POA: Diagnosis not present

## 2014-06-07 DIAGNOSIS — Z79899 Other long term (current) drug therapy: Secondary | ICD-10-CM | POA: Insufficient documentation

## 2014-06-07 DIAGNOSIS — R197 Diarrhea, unspecified: Secondary | ICD-10-CM | POA: Diagnosis not present

## 2014-06-07 DIAGNOSIS — F419 Anxiety disorder, unspecified: Secondary | ICD-10-CM | POA: Insufficient documentation

## 2014-06-07 DIAGNOSIS — R509 Fever, unspecified: Secondary | ICD-10-CM

## 2014-06-07 DIAGNOSIS — I10 Essential (primary) hypertension: Secondary | ICD-10-CM | POA: Insufficient documentation

## 2014-06-07 DIAGNOSIS — Z3202 Encounter for pregnancy test, result negative: Secondary | ICD-10-CM | POA: Insufficient documentation

## 2014-06-07 LAB — CBC WITH DIFFERENTIAL/PLATELET
BASOS ABS: 0 10*3/uL (ref 0.0–0.1)
BASOS PCT: 0 % (ref 0–1)
EOS ABS: 0 10*3/uL (ref 0.0–0.7)
EOS PCT: 0 % (ref 0–5)
HCT: 44.2 % (ref 36.0–46.0)
Hemoglobin: 14.3 g/dL (ref 12.0–15.0)
LYMPHS ABS: 0.8 10*3/uL (ref 0.7–4.0)
LYMPHS PCT: 7 % — AB (ref 12–46)
MCH: 26 pg (ref 26.0–34.0)
MCHC: 32.4 g/dL (ref 30.0–36.0)
MCV: 80.5 fL (ref 78.0–100.0)
MONOS PCT: 5 % (ref 3–12)
Monocytes Absolute: 0.5 10*3/uL (ref 0.1–1.0)
NEUTROS ABS: 9.2 10*3/uL — AB (ref 1.7–7.7)
Neutrophils Relative %: 88 % — ABNORMAL HIGH (ref 43–77)
PLATELETS: 298 10*3/uL (ref 150–400)
RBC: 5.49 MIL/uL — AB (ref 3.87–5.11)
RDW: 14 % (ref 11.5–15.5)
WBC: 10.5 10*3/uL (ref 4.0–10.5)

## 2014-06-07 LAB — URINALYSIS, ROUTINE W REFLEX MICROSCOPIC
GLUCOSE, UA: NEGATIVE mg/dL
HGB URINE DIPSTICK: NEGATIVE
KETONES UR: NEGATIVE mg/dL
Nitrite: NEGATIVE
Protein, ur: 30 mg/dL — AB
UROBILINOGEN UA: 0.2 mg/dL (ref 0.0–1.0)
pH: 6 (ref 5.0–8.0)

## 2014-06-07 LAB — COMPREHENSIVE METABOLIC PANEL
ALBUMIN: 4.5 g/dL (ref 3.5–5.2)
ALT: 44 U/L — ABNORMAL HIGH (ref 0–35)
ANION GAP: 11 (ref 5–15)
AST: 37 U/L (ref 0–37)
Alkaline Phosphatase: 45 U/L (ref 39–117)
BUN: 15 mg/dL (ref 6–23)
CO2: 24 mmol/L (ref 19–32)
Calcium: 9.7 mg/dL (ref 8.4–10.5)
Chloride: 105 mmol/L (ref 96–112)
Creatinine, Ser: 0.98 mg/dL (ref 0.50–1.10)
GFR calc Af Amer: 88 mL/min — ABNORMAL LOW (ref >90)
GFR, EST NON AFRICAN AMERICAN: 76 mL/min — AB (ref >90)
Glucose, Bld: 116 mg/dL — ABNORMAL HIGH (ref 70–99)
Potassium: 4.1 mmol/L (ref 3.5–5.1)
Sodium: 140 mmol/L (ref 135–145)
Total Bilirubin: 0.7 mg/dL (ref 0.3–1.2)
Total Protein: 8.2 g/dL (ref 6.0–8.3)

## 2014-06-07 LAB — LIPASE, BLOOD: LIPASE: 25 U/L (ref 11–59)

## 2014-06-07 LAB — URINE MICROSCOPIC-ADD ON

## 2014-06-07 LAB — PREGNANCY, URINE: Preg Test, Ur: NEGATIVE

## 2014-06-07 MED ORDER — SODIUM CHLORIDE 0.9 % IV BOLUS (SEPSIS)
1000.0000 mL | Freq: Once | INTRAVENOUS | Status: AC
  Administered 2014-06-08: 1000 mL via INTRAVENOUS

## 2014-06-07 MED ORDER — SODIUM CHLORIDE 0.9 % IV BOLUS (SEPSIS)
2000.0000 mL | Freq: Once | INTRAVENOUS | Status: AC
  Administered 2014-06-07: 2000 mL via INTRAVENOUS

## 2014-06-07 MED ORDER — FENTANYL CITRATE (PF) 100 MCG/2ML IJ SOLN
50.0000 ug | Freq: Once | INTRAMUSCULAR | Status: AC
  Administered 2014-06-08: 50 ug via INTRAVENOUS
  Filled 2014-06-07: qty 2

## 2014-06-07 MED ORDER — ONDANSETRON 8 MG PO TBDP
8.0000 mg | ORAL_TABLET | Freq: Once | ORAL | Status: AC
  Administered 2014-06-07: 8 mg via ORAL
  Filled 2014-06-07: qty 1

## 2014-06-07 MED ORDER — KETOROLAC TROMETHAMINE 30 MG/ML IJ SOLN
30.0000 mg | Freq: Once | INTRAMUSCULAR | Status: AC
  Administered 2014-06-07: 30 mg via INTRAVENOUS
  Filled 2014-06-07: qty 1

## 2014-06-07 NOTE — ED Notes (Signed)
Pt admits to onset of loose stools at 0500 this morning, has also been experiencing n/v - denies fever. Pt now admits to generalized fatigue and abd cramping as well.

## 2014-06-07 NOTE — ED Provider Notes (Signed)
CSN: [id number]     Arrival date & time 06/07/14  1854 History   First MD Initiated Contact with Patient 06/07/14 2145     Chief Complaint  Patient presents with  . Emesis  . Diarrhea     (Consider location/radiation/quality/duration/timing/severity/associated sxs/prior Treatment) HPI [name] is a 34 y.o. female history of hypertension who comes in for evaluation of nausea, vomiting and diarrhea. Patient states this morning at 5:00 AM she had a loose bowel movement, nonbloody. She denies any overtly foul smell. No recent antibiotic treatment. She also reports associated nausea and vomiting today. She reports intermittent and alternating diarrhea and vomiting throughout the day, all day. She has tried Imodium without relief of her symptoms. She reports receiving Zofran here and that has helped with her nausea. She also reports an overall feeling of fatigue She does report a focal area of discomfort under her left ribs. She has been seen for this by her primary care and this is not new. She rates her overall discomfort as a 5/10. She denies any other abdominal discomfort, chest pain, shortness of breath, rash, leg swelling, recent travels or surgeries, no other sick contacts, no urinary symptoms, no vaginal bleeding or discharge.  Past Medical History  Diagnosis Date  . Anxiety   . Hypertension    Past Surgical History  Procedure Laterality Date  . Right acl reconstruction    . Cesarean section     Family History  Problem Relation Age of Onset  . Hypertension Mother   . Diabetes Father   . Hypertension Father   . Mental illness Father   . Cancer Maternal Grandmother   . Diabetes Maternal Grandmother   . Hypertension Maternal Grandmother   . Stroke Paternal Grandmother    History  Substance Use Topics  . Smoking status: Never Smoker   . Smokeless tobacco: Not on file  . Alcohol Use: No   OB History    No data available     Review of Systems A 10 point review of systems  was completed and was negative except for pertinent positives and negatives as mentioned in the history of present illness     Allergies  Review of patient's allergies indicates no known allergies.  Home Medications   Prior to Admission medications   Medication Sig Start Date End Date Taking? Authorizing Provider  citalopram (CELEXA) 10 MG tablet Take 1 tablet (10 mg total) by mouth daily. Patient taking differently: Take 10 mg by mouth at bedtime.  01/04/14  Yes Thao P Le, DO  loperamide (IMODIUM A-D) 2 MG tablet Take 2 mg by mouth 4 (four) times daily as needed for diarrhea or loose stools.   Yes Historical Provider, MD  losartan (COZAAR) 50 MG tablet Take 50 mg by mouth at bedtime.   Yes Historical Provider, MD  lisinopril-hydrochlorothiazide (PRINZIDE,ZESTORETIC) 20-12.5 MG per tablet Take 1 tablet by mouth daily. Patient not taking: Reported on 06/07/2014 01/04/14   Thao P Le, DO  metFORMIN (GLUCOPHAGE) 500 MG tablet Take 1 tablet (500 mg total) by mouth daily. Patient not taking: Reported on 06/07/2014 01/04/14   Thao P Le, DO  nystatin (MYCOSTATIN/NYSTOP) 100000 UNIT/GM POWD Use as directed TID Patient not taking: Reported on 06/07/2014 01/04/14   Thao P Le, DO   BP 124/74 mmHg  Pulse 88  Temp(Src) 97.8 F (36.6 C) (Oral)  Resp 18  SpO2 99%  LMP 04/16/2014 (LMP Unknown) Physical Exam  Constitutional: She is oriented to person, place, and time.  She appears well-developed and well-nourished.  HENT:  Head: Normocephalic and atraumatic.  Mouth/Throat: Oropharynx is clear and moist.  Eyes: Conjunctivae are normal. Pupils are equal, round, and reactive to light. Right eye exhibits no discharge. Left eye exhibits no discharge. No scleral icterus.  Neck: Neck supple.  Cardiovascular: Normal rate, regular rhythm and normal heart sounds.   Pulmonary/Chest: Effort normal and breath sounds normal. No respiratory distress. She has no wheezes. She has no rales.  Abdominal: Soft.   Tenderness in left upper quadrant. Abdomen is otherwise soft, nondistended and nontender without rebound or guarding. No peritoneal signs.  Musculoskeletal: She exhibits no tenderness.  Neurological: She is alert and oriented to person, place, and time.  Cranial Nerves II-XII grossly intact  Skin: Skin is warm and dry. No rash noted.  Psychiatric: She has a normal mood and affect.  Nursing note and vitals reviewed.   ED Course  Procedures (including critical care time) Labs Review Labs Reviewed  CBC WITH DIFFERENTIAL/PLATELET - Abnormal; Notable for the following:    RBC 5.49 (*)    Neutrophils Relative % 88 (*)    Neutro Abs 9.2 (*)    Lymphocytes Relative 7 (*)    All other components within normal limits  COMPREHENSIVE METABOLIC PANEL - Abnormal; Notable for the following:    Glucose, Bld 116 (*)    ALT 44 (*)    GFR calc non Af Amer 76 (*)    GFR calc Af Amer 88 (*)    All other components within normal limits  URINALYSIS, ROUTINE W REFLEX MICROSCOPIC - Abnormal; Notable for the following:    Color, Urine AMBER (*)    Specific Gravity, Urine >1.046 (*)    Bilirubin Urine SMALL (*)    Protein, ur 30 (*)    Leukocytes, UA TRACE (*)    All other components within normal limits  URINE MICROSCOPIC-ADD ON - Abnormal; Notable for the following:    Squamous Epithelial / LPF FEW (*)    Bacteria, UA FEW (*)    All other components within normal limits  LIPASE, BLOOD  PREGNANCY, URINE    Imaging Review No results found.   EKG Interpretation None     Meds given in ED:  Medications  ondansetron (ZOFRAN-ODT) disintegrating tablet 8 mg (8 mg Oral Given 06/07/14 1944)  sodium chloride 0.9 % bolus 2,000 mL (0 mLs Intravenous Stopped 06/08/14 0252)  ketorolac (TORADOL) 30 MG/ML injection 30 mg (30 mg Intravenous Given 06/07/14 2238)  sodium chloride 0.9 % bolus 1,000 mL (0 mLs Intravenous Stopped 06/08/14 0340)  fentaNYL (SUBLIMAZE) injection 50 mcg (50 mcg Intravenous Given  06/08/14 0026)  sodium chloride 0.9 % bolus 1,000 mL (0 mLs Intravenous Stopped 06/08/14 0253)  HYDROmorphone (DILAUDID) injection 0.5 mg (0.5 mg Intravenous Given 06/08/14 0248)  gi cocktail (Maalox,Lidocaine,Donnatal) (30 mLs Oral Given 06/08/14 0248)    Discharge Medication List as of 06/08/2014  1:07 AM     Filed Vitals:   06/07/14 2334 06/08/14 0235 06/08/14 0326 06/08/14 0342  BP: 124/75 120/65 120/65 124/74  Pulse: 96 94 89 88  Temp: 99.3 F (37.4 C) 98.7 F (37.1 C)  97.8 F (36.6 C)  TempSrc: Oral Oral  Oral  Resp: SpO2: 99% 98% 97% 99%    MDM  Vitals stable - WNL -afebrile Pt resting comfortably in ED. Tolerating PO PE--benign abdominal exam. Grossly benign physical exam. No nuchal rigidity or meningismus. Labwork-evidence of mild dehydration, we'll treat with IV fluids.  Otherwise noncontributory. Urine likely dirty catch On reevaluation patient reports headache, but no nausea, vomiting or diarrhea in ED. After second round of analgesia, pt reports HA has resolved, pt feels well and is ready to go home.  DDX--patient symptoms likely secondary to viral syndrome. No evidence of meningitis or other acute intra-abdominal pathology. Will DC with instructions to continue symptomatic support at home with Imodium, Motrin and Tylenol. Continue with maintenance of fluid rehydration strategies. I discussed all relevant lab findings and imaging results with pt and they verbalized understanding. Discussed f/u with PCP within 48 hrs and return precautions, pt very amenable to plan.  Final diagnoses:  Non-intractable vomiting with nausea, vomiting of unspecified type  Fever, unspecified fever cause  Diarrhea        Joycie PeekBenjamin Jayston Trevino, PA-C 06/08/14 1008  Gilda Creasehristopher J Pollina, MD 06/10/14 [phone]

## 2014-06-08 MED ORDER — SODIUM CHLORIDE 0.9 % IV BOLUS (SEPSIS)
1000.0000 mL | Freq: Once | INTRAVENOUS | Status: AC
  Administered 2014-06-08: 1000 mL via INTRAVENOUS

## 2014-06-08 MED ORDER — HYDROMORPHONE HCL 1 MG/ML IJ SOLN
0.5000 mg | Freq: Once | INTRAMUSCULAR | Status: AC
  Administered 2014-06-08: 0.5 mg via INTRAVENOUS
  Filled 2014-06-08: qty 1

## 2014-06-08 MED ORDER — GI COCKTAIL ~~LOC~~
30.0000 mL | Freq: Once | ORAL | Status: AC
  Administered 2014-06-08: 30 mL via ORAL
  Filled 2014-06-08: qty 30

## 2014-06-08 NOTE — Discharge Instructions (Signed)
Diarrhea °Diarrhea is frequent loose and watery bowel movements. It can cause you to feel weak and dehydrated. Dehydration can cause you to become tired and thirsty, have a dry mouth, and have decreased urination that often is dark yellow. Diarrhea is a sign of another problem, most often an infection that will not last long. In most cases, diarrhea typically lasts 2-3 days. However, it can last longer if it is a sign of something more serious. It is important to treat your diarrhea as directed by your caregiver to lessen or prevent future episodes of diarrhea. °CAUSES  °Some common causes include: °· Gastrointestinal infections caused by viruses, bacteria, or parasites. °· Food poisoning or food allergies. °· Certain medicines, such as antibiotics, chemotherapy, and laxatives. °· Artificial sweeteners and fructose. °· Digestive disorders. °HOME CARE INSTRUCTIONS °· Ensure adequate fluid intake (hydration): Have 1 cup (8 oz) of fluid for each diarrhea episode. Avoid fluids that contain simple sugars or sports drinks, fruit juices, whole milk products, and sodas. Your urine should be clear or pale yellow if you are drinking enough fluids. Hydrate with an oral rehydration solution that you can purchase at pharmacies, retail stores, and online. You can prepare an oral rehydration solution at home by mixing the following ingredients together: °·  - tsp table salt. °· ¾ tsp baking soda. °·  tsp salt substitute containing potassium chloride. °· 1  tablespoons sugar. °· 1 L (34 oz) of water. °· Certain foods and beverages may increase the speed at which food moves through the gastrointestinal (GI) tract. These foods and beverages should be avoided and include: °· Caffeinated and alcoholic beverages. °· High-fiber foods, such as raw fruits and vegetables, nuts, seeds, and whole grain breads and cereals. °· Foods and beverages sweetened with sugar alcohols, such as xylitol, sorbitol, and mannitol. °· Some foods may be well  tolerated and may help thicken stool including: °· Starchy foods, such as rice, toast, pasta, low-sugar cereal, oatmeal, grits, baked potatoes, crackers, and bagels. °· Bananas. °· Applesauce. °· Add probiotic-rich foods to help increase healthy bacteria in the GI tract, such as yogurt and fermented milk products. °· Wash your hands well after each diarrhea episode. °· Only take over-the-counter or prescription medicines as directed by your caregiver. °· Take a warm bath to relieve any burning or pain from frequent diarrhea episodes. °SEEK IMMEDIATE MEDICAL CARE IF:  °· You are unable to keep fluids down. °· You have persistent vomiting. °· You have blood in your stool, or your stools are black and tarry. °· You do not urinate in 6-8 hours, or there is only a small amount of very dark urine. °· You have abdominal pain that increases or localizes. °· You have weakness, dizziness, confusion, or light-headedness. °· You have a severe headache. °· Your diarrhea gets worse or does not get better. °· You have a fever or persistent symptoms for more than 2-3 days. °· You have a fever and your symptoms suddenly get worse. °MAKE SURE YOU:  °· Understand these instructions. °· Will watch your condition. °· Will get help right away if you are not doing well or get worse. °Document Released: 01/18/2002 Document Revised: 06/14/2013 Document Reviewed: 10/06/2011 °Exit[employer]® Patient Information ©2015 [employer]. This information is not intended to replace advice given to you by your health care provider. Make sure you discuss any questions you have with your health care provider. ° °Nausea and Vomiting °Nausea is a sick feeling that often comes before throwing up (vomiting). Vomiting   is a reflex where stomach contents come out of your mouth. Vomiting can cause severe loss of body fluids (dehydration). Children and elderly adults can become dehydrated quickly, especially if they also have diarrhea. Nausea and vomiting are  symptoms of a condition or disease. It is important to find the cause of your symptoms. CAUSES   Direct irritation of the stomach lining. This irritation can result from increased acid production (gastroesophageal reflux disease), infection, food poisoning, taking certain medicines (such as nonsteroidal anti-inflammatory drugs), alcohol use, or tobacco use.  Signals from the brain.These signals could be caused by a headache, heat exposure, an inner ear disturbance, increased pressure in the brain from injury, infection, a tumor, or a concussion, pain, emotional stimulus, or metabolic problems.  An obstruction in the gastrointestinal tract (bowel obstruction).  Illnesses such as diabetes, hepatitis, gallbladder problems, appendicitis, kidney problems, cancer, sepsis, atypical symptoms of a heart attack, or eating disorders.  Medical treatments such as chemotherapy and radiation.  Receiving medicine that makes you sleep (general anesthetic) during surgery. DIAGNOSIS Your caregiver may ask for tests to be done if the problems do not improve after a few days. Tests may also be done if symptoms are severe or if the reason for the nausea and vomiting is not clear. Tests may include:  Urine tests.  Blood tests.  Stool tests.  Cultures (to look for evidence of infection).  X-rays or other imaging studies. Test results can help your caregiver make decisions about treatment or the need for additional tests. TREATMENT You need to stay well hydrated. Drink frequently but in small amounts.You may wish to drink water, sports drinks, clear broth, or eat frozen ice pops or gelatin dessert to help stay hydrated.When you eat, eating slowly may help prevent nausea.There are also some antinausea medicines that may help prevent nausea. HOME CARE INSTRUCTIONS   Take all medicine as directed by your caregiver.  If you do not have an appetite, do not force yourself to eat. However, you must continue to  drink fluids.  If you have an appetite, eat a normal diet unless your caregiver tells you differently.  Eat a variety of complex carbohydrates (rice, wheat, potatoes, bread), lean meats, yogurt, fruits, and vegetables.  Avoid high-fat foods because they are more difficult to digest.  Drink enough water and fluids to keep your urine clear or pale yellow.  If you are dehydrated, ask your caregiver for specific rehydration instructions. Signs of dehydration may include:  Severe thirst.  Dry lips and mouth.  Dizziness.  Dark urine.  Decreasing urine frequency and amount.  Confusion.  Rapid breathing or pulse. SEEK IMMEDIATE MEDICAL CARE IF:   You have blood or brown flecks (like coffee grounds) in your vomit.  You have black or bloody stools.  You have a severe headache or stiff neck.  You are confused.  You have severe abdominal pain.  You have chest pain or trouble breathing.  You do not urinate at least once every 8 hours.  You develop cold or clammy skin.  You continue to vomit for longer than 24 to 48 hours.  You have a fever. MAKE SURE YOU:   Understand these instructions.  Will watch your condition.  Will get help right away if you are not doing well or get worse. Document Released: 01/28/2005 Document Revised: 04/22/2011 Document Reviewed: 06/27/2010 [facility] Patient Information 2015 [place], [place]. This information is not intended to replace advice given to you by your health care provider. Make sure you discuss  any questions you have with your health care provider.  Please follow-up with her primary care for further evaluation and management of your symptoms. There does not appear to be an emergent cause her symptoms at this time. It is important for you to continue to rehydrate. Return to ED for new or worsening symptoms.

## 2014-08-27 ENCOUNTER — Ambulatory Visit
Admission: EM | Admit: 2014-08-27 | Discharge: 2014-08-27 | Disposition: A | Discharge status: Home / Self Care | Payer: Managed Care, Other (non HMO) | Attending: Family Medicine | Admitting: Family Medicine

## 2014-08-27 ENCOUNTER — Encounter: Payer: Self-pay | Admitting: Emergency Medicine

## 2014-08-27 DIAGNOSIS — M7050 Other bursitis of knee, unspecified knee: Secondary | ICD-10-CM

## 2014-08-27 DIAGNOSIS — M7150 Other bursitis, not elsewhere classified, unspecified site: Secondary | ICD-10-CM | POA: Diagnosis not present

## 2014-08-27 MED ORDER — NAPROXEN 500 MG PO TABS: 500.0000 mg | ORAL_TABLET | Freq: Two times a day (BID) | ORAL | Status: DC

## 2014-08-27 MED ORDER — IBUPROFEN 800 MG PO TABS
800.0000 mg | ORAL_TABLET | Freq: Once | ORAL | Status: AC
  Administered 2014-08-27: 800 mg via ORAL

## 2014-08-27 MED ORDER — MUPIROCIN 2 % EX OINT: 1.0000 "application " | TOPICAL_OINTMENT | Freq: Three times a day (TID) | CUTANEOUS | Status: DC

## 2014-08-27 NOTE — Discharge Instructions (Signed)
Bursitis Bursitis is inflammation of a bursa. A bursa is a soft, fluid-filled sac. It cushions the soft tissue around a bone. Bursitis often occurs in the bursas near the shoulders, elbows, knees, pelvis, hips, heel, and Achilles tendon.  SYMPTOMS   Pain and tenderness in the affected area. Sometimes, pain radiates into surrounding areas. Specifically, pain with movement.  Limited range of motion of the affected joint.  Sometimes, painless swelling of the bursa.  Fever (when infected). CAUSES   Injury to a joint or bursa.  Overuse or strenuous exercise of a joint.  Gout (disease with inflamed joints).  Prolonged pressure on a joint containing bursas (resting on an elbow or kneeling).  Arthritis.  Acute or chronic infection.  Calcium deposits in shoulder tendons, with degeneration of the tendon. RISK INCREASES WITH:  Vigorous, repeated, or sudden increase in athletic training or activity level.  Failure to warm up properly.  Overstretching.  Improper exercise technique.  Playing sports on [place]. PREVENTION  Avoid injuries or overuse of muscles.  Warm up and cool down properly. Do this before and after physical activity.  Maintain proper conditioning:  Joint flexibility.  Muscle strength and endurance.  Cardiovascular fitness.  Learn and use proper technique.  Wear protective equipment. PROGNOSIS  With proper treatment, symptoms often go away within 7 to 14 days.  RELATED COMPLICATIONS   Frequent recurrence of symptoms. This can result in a chronic, repetitive problem.  Joint stiffness.  Limited joint movement.  Infection of bursa.  Chronic inflammation or scarring of bursa. TREATMENT Treatment first involves protecting and resting the bursa and its joint. You may use ice or an elastic bandage to reduce inflammation. Anti-inflammatory medicines may help resolve the swelling. If symptoms persist despite treatment, a caregiver may withdraw fluid  from the bursa. They might also consider a corticosteroid injection. Sometimes, bursitis will persist in spite of nonsurgical treatment or will become infected. These cases may require removal (surgical excision) of the bursa.  MEDICATION   If pain medicine is needed, nonsteroidal anti-inflammatory medicines, such as aspirin and ibuprofen, or other minor pain relievers, such as acetaminophen, are often recommended.  Do not take pain medicine for 7 days before surgery.  Prescription pain relievers are usually only prescribed after surgery. Use only as directed and only as much as you need.  Ointments applied to the skin may be helpful.  Corticosteroid injections may be given. This is done to reduce inflammation in the bursa. HEAT AND COLD:  Cold treatment (icing) relieves pain and reduces inflammation. Cold treatment should be applied for 10 to 15 minutes every 2 to 3 hours for inflammation and pain, and immediately after any activity that aggravates your symptoms. Use ice packs or an ice massage.  Heat treatment may be used prior to performing the stretching and strengthening activities prescribed by your caregiver, physical therapist, or athletic trainer. Use a heat pack or a warm soak. SEEK MEDICAL CARE IF:   Symptoms get worse or do not improve in 2 weeks, despite treatment.  New, unexplained symptoms develop. (Drugs used in treatment may produce side effects.) Document Released: 01/28/2005 Document Revised: 06/14/2013 Document Reviewed: 05/12/2008 [employer] Patient Information 2015 [employer]. This information is not intended to replace advice given to you by your health care provider. Make sure you discuss any questions you have with your health care provider.  

## 2014-08-27 NOTE — ED Notes (Addendum)
Right knee pain since yesterday. Have appointment coming up with Orthopedic Surgeon in August. Hx of Knee ACL Reconsturction  Bee sting left ankle.

## 2014-08-27 NOTE — ED Provider Notes (Signed)
CSN: [id number]     Arrival date & time 08/27/14  1601 History   First MD Initiated Contact with Patient 08/27/14 1630     Chief Complaint  Patient presents with  . Knee Pain  . Insect Bite    left ankle   (Consider location/radiation/quality/duration/timing/severity/associated sxs/prior Treatment) HPI   34 year old female who presents with left antero-medial knee pain. She states that last night she was in a lot of pain. Is been trying to increase her distance around the neighborhood and increased it today by double. Iliac where she began to feel the pain. Any pressure over the area is very painful and she does have some swelling. Any weightbearing seems to bother her quite a bit. His had a anterior cruciate ligament reconstruction in that knee in the past utilizing cadaver tendon. She tried Biofreeze ointment but this was not effective in helping her.  Past Medical History  Diagnosis Date  . Anxiety   . Hypertension    Past Surgical History  Procedure Laterality Date  . Right acl reconstruction    . Cesarean section     Family History  Problem Relation Age of Onset  . Hypertension Mother   . Diabetes Father   . Hypertension Father   . Mental illness Father   . Cancer Maternal Grandmother   . Diabetes Maternal Grandmother   . Hypertension Maternal Grandmother   . Stroke Paternal Grandmother    History  Substance Use Topics  . Smoking status: Never Smoker   . Smokeless tobacco: Not on file  . Alcohol Use: No   OB History    No data available     Review of Systems  Musculoskeletal: Positive for joint swelling.  All other systems reviewed and are negative.   Allergies  Review of patient's allergies indicates no known allergies.  Home Medications   Prior to Admission medications   Medication Sig Start Date End Date Taking? Authorizing Provider  citalopram (CELEXA) 10 MG tablet Take 1 tablet (10 mg total) by mouth daily. Patient taking differently: Take 10 mg by  mouth at bedtime.  01/04/14  Yes Thao P Le, DO  folic acid (FOLVITE) 800 MCG tablet Take 400 mcg by mouth daily.   Yes Historical Provider, MD  losartan (COZAAR) 50 MG tablet Take 50 mg by mouth at bedtime.   Yes Historical Provider, MD  lisinopril-hydrochlorothiazide (PRINZIDE,ZESTORETIC) 20-12.5 MG per tablet Take 1 tablet by mouth daily. Patient not taking: Reported on 06/07/2014 01/04/14   Thao P Le, DO  loperamide (IMODIUM A-D) 2 MG tablet Take 2 mg by mouth 4 (four) times daily as needed for diarrhea or loose stools.    Historical Provider, MD  metFORMIN (GLUCOPHAGE) 500 MG tablet Take 1 tablet (500 mg total) by mouth daily. Patient not taking: Reported on 06/07/2014 01/04/14   Thao P Le, DO  mupirocin ointment (BACTROBAN) 2 % Apply 1 application topically 3 (three) times daily. 08/27/14   Lutricia Feil, PA-C  naproxen (NAPROSYN) 500 MG tablet Take 1 tablet (500 mg total) by mouth 2 (two) times daily with a meal. 08/27/14   Lutricia Feil, PA-C  nystatin (MYCOSTATIN/NYSTOP) 100000 UNIT/GM POWD Use as directed TID Patient not taking: Reported on 06/07/2014 01/04/14   Thao P Le, DO   BP 148/97 mmHg  Pulse 76  Temp(Src) 98.4 F (36.9 C) (Tympanic)  Resp 16  Ht  (1.702 m)  Wt 218 lb (98.884 kg)  BMI 34.14 kg/m2  SpO2 99%  LMP 08/25/2014  Physical Exam  Constitutional: She is oriented to person, place, and time. She appears well-developed and well-nourished.  HENT:  Head: Normocephalic and atraumatic.  Eyes: Pupils are equal, round, and reactive to light.  Musculoskeletal:  Exam is no left knee shows mild swelling anteromedially over the Pes anserine  and surrounding bursa. Patient has very loose patellae bilaterally. Is no patellar apprehension test there is no retropatellar tenderness. Her sepsis is soft but shows good control and strength. There is no cerumen joint line tenderness. There is no collateral ligament tenderness. There is no effusion of the knee.  Neurological: She  is alert and oriented to person, place, and time.  Skin: Skin is warm and dry.  Psychiatric: She has a normal mood and affect. Her behavior is normal. Judgment and thought content normal.  Nursing note and vitals reviewed.   ED Course  Procedures (including critical care time) Labs Review Labs Reviewed - No data to display  Imaging Review No results found.   MDM   1. Pes anserine bursitis    Discharge Medication List as of 08/27/2014  4:56 PM    START taking these medications   Details  mupirocin ointment (BACTROBAN) 2 % Apply 1 application topically 3 (three) times daily., Starting 08/27/2014, Until Discontinued, Print    naproxen (NAPROSYN) 500 MG tablet Take 1 tablet (500 mg total) by mouth 2 (two) times daily with a meal., Starting 08/27/2014, Until Discontinued, Print      Plan: 1. Test/x-ray results and diagnosis reviewed with patient 2. rx as per orders; risks, benefits, potential side effects reviewed with patient 3. Recommend supportive treatment with ice/heat. Quad exercises. Symptom avoidance. 4. F/u prn if symptoms worsen or don't improve with PCP      Lutricia FeilWilliam P Eilan Mcinerny, PA-C 08/27/14 1701  Lutricia FeilWilliam P Justino Boze, PA-C 08/27/14 1704

## 2014-09-01 ENCOUNTER — Other Ambulatory Visit: Payer: Self-pay | Admitting: Family Medicine

## 2014-09-01 ENCOUNTER — Ambulatory Visit
Admission: RE | Admit: 2014-09-01 | Discharge: 2014-09-01 | Disposition: A | Discharge status: Home / Self Care | Payer: Managed Care, Other (non HMO) | Source: Ambulatory Visit | Attending: Family Medicine | Admitting: Family Medicine

## 2014-09-01 DIAGNOSIS — M25561 Pain in right knee: Secondary | ICD-10-CM

## 2015-09-21 ENCOUNTER — Emergency Department (HOSPITAL_COMMUNITY)
Admission: EM | Admit: 2015-09-21 | Discharge: 2015-09-22 | Disposition: A | Discharge status: Home / Self Care | Payer: BLUE CROSS/BLUE SHIELD | Attending: Emergency Medicine | Admitting: Emergency Medicine

## 2015-09-21 ENCOUNTER — Encounter (HOSPITAL_COMMUNITY): Payer: Self-pay | Admitting: Emergency Medicine

## 2015-09-21 DIAGNOSIS — M5412 Radiculopathy, cervical region: Secondary | ICD-10-CM | POA: Diagnosis not present

## 2015-09-21 DIAGNOSIS — B372 Candidiasis of skin and nail: Secondary | ICD-10-CM

## 2015-09-21 DIAGNOSIS — F411 Generalized anxiety disorder: Secondary | ICD-10-CM

## 2015-09-21 DIAGNOSIS — Z79899 Other long term (current) drug therapy: Secondary | ICD-10-CM | POA: Insufficient documentation

## 2015-09-21 DIAGNOSIS — Z7982 Long term (current) use of aspirin: Secondary | ICD-10-CM | POA: Insufficient documentation

## 2015-09-21 DIAGNOSIS — R5383 Other fatigue: Secondary | ICD-10-CM

## 2015-09-21 DIAGNOSIS — I10 Essential (primary) hypertension: Secondary | ICD-10-CM

## 2015-09-21 DIAGNOSIS — R739 Hyperglycemia, unspecified: Secondary | ICD-10-CM

## 2015-09-21 DIAGNOSIS — R079 Chest pain, unspecified: Secondary | ICD-10-CM | POA: Diagnosis present

## 2015-09-21 NOTE — ED Triage Notes (Addendum)
Pt c/o sharp stabbing chest pain, that started this afternoon. Left side of chest, states movement aggravates it. Denies SOB, n/v. Pt has been out of BP meds since about May.

## 2015-09-22 ENCOUNTER — Emergency Department (HOSPITAL_COMMUNITY): Payer: BLUE CROSS/BLUE SHIELD

## 2015-09-22 LAB — COMPREHENSIVE METABOLIC PANEL
ALBUMIN: 3.9 g/dL (ref 3.5–5.0)
ALK PHOS: 37 U/L — AB (ref 38–126)
ALT: 18 U/L (ref 14–54)
AST: 21 U/L (ref 15–41)
Anion gap: 6 (ref 5–15)
BILIRUBIN TOTAL: 0.7 mg/dL (ref 0.3–1.2)
BUN: 8 mg/dL (ref 6–20)
CO2: 26 mmol/L (ref 22–32)
Calcium: 9 mg/dL (ref 8.9–10.3)
Chloride: 106 mmol/L (ref 101–111)
Creatinine, Ser: 0.89 mg/dL (ref 0.44–1.00)
GFR calc Af Amer: >60 mL/min (ref >60)
GFR calc non Af Amer: >60 mL/min (ref >60)
GLUCOSE: 140 mg/dL — AB (ref 65–99)
POTASSIUM: 3.3 mmol/L — AB (ref 3.5–5.1)
Sodium: 138 mmol/L (ref 135–145)
TOTAL PROTEIN: 7.3 g/dL (ref 6.5–8.1)

## 2015-09-22 LAB — D-DIMER, QUANTITATIVE (NOT AT ARMC)

## 2015-09-22 LAB — CBC WITH DIFFERENTIAL/PLATELET
Basophils Absolute: 0 10*3/uL (ref 0.0–0.1)
Basophils Relative: 1 %
EOS ABS: 0.3 10*3/uL (ref 0.0–0.7)
Eosinophils Relative: 3 %
HEMATOCRIT: 36.9 % (ref 36.0–46.0)
HEMOGLOBIN: 12.1 g/dL (ref 12.0–15.0)
LYMPHS ABS: 3 10*3/uL (ref 0.7–4.0)
Lymphocytes Relative: 36 %
MCH: 25.5 pg — ABNORMAL LOW (ref 26.0–34.0)
MCHC: 32.8 g/dL (ref 30.0–36.0)
MCV: 77.8 fL — AB (ref 78.0–100.0)
MONOS PCT: 7 %
Monocytes Absolute: 0.6 10*3/uL (ref 0.1–1.0)
NEUTROS PCT: 53 %
Neutro Abs: 4.5 10*3/uL (ref 1.7–7.7)
Platelets: 271 10*3/uL (ref 150–400)
RBC: 4.74 MIL/uL (ref 3.87–5.11)
RDW: 14.8 % (ref 11.5–15.5)
WBC: 8.5 10*3/uL (ref 4.0–10.5)

## 2015-09-22 LAB — I-STAT TROPONIN, ED: Troponin i, poc: 0 ng/mL (ref 0.00–0.08)

## 2015-09-22 MED ORDER — LOSARTAN POTASSIUM-HCTZ 100-25 MG PO TABS: 1.0000 | ORAL_TABLET | Freq: Every day | ORAL | 0 refills | Status: DC

## 2015-09-22 MED ORDER — ONDANSETRON 8 MG PO TBDP
8.0000 mg | ORAL_TABLET | Freq: Once | ORAL | Status: AC
  Administered 2015-09-22: 8 mg via ORAL
  Filled 2015-09-22: qty 1

## 2015-09-22 MED ORDER — OXYCODONE-ACETAMINOPHEN 5-325 MG PO TABS: 1.0000 | ORAL_TABLET | Freq: Four times a day (QID) | ORAL | 0 refills | Status: DC | PRN

## 2015-09-22 MED ORDER — HYDROMORPHONE HCL 2 MG/ML IJ SOLN
2.0000 mg | Freq: Once | INTRAMUSCULAR | Status: AC
  Administered 2015-09-22: 2 mg via INTRAMUSCULAR
  Filled 2015-09-22: qty 1

## 2015-09-22 MED ORDER — LISINOPRIL-HYDROCHLOROTHIAZIDE 20-12.5 MG PO TABS: 1.0000 | ORAL_TABLET | Freq: Every day | ORAL | 1 refills | Status: DC

## 2015-09-22 NOTE — ED Provider Notes (Signed)
[facility]-EMERGENCY DEPT Provider Note   CSN: [id number] Arrival date & time: 09/21/15  2236  First Provider Contact:  None    By signing my name below, I, Emmanuella Mensah, attest that this documentation has been prepared under the direction and in the presence of Paula Libra, MD. Electronically Signed: Angelene Giovanni, ED Scribe. 09/22/15. 3:01 AM.   History   Chief Complaint Chief Complaint  Patient presents with  . Chest Pain    HPI Comments: [name] is a 35 y.o. female who presents to the Emergency Department complaining of ongoing severe sharp left-sided (pectoral region) chest pain that radiates towards her back onset yesterday am. She reports notes that the pain is worse with movement, especially her neck and left shoulder. Pt has not tried any medications PTA. She reports that she has not been complaint with her HTN medications. She denies any n/v, shortness of breath, leg pain/swelling, numbness, or weakness.   PCP: Dr. Duanne Guess  The history is provided by the patient. No language interpreter was used.    Past Medical History:  Diagnosis Date  . Anxiety   . Hypertension     There are no active problems to display for this patient.   Past Surgical History:  Procedure Laterality Date  . CESAREAN SECTION    . right acl reconstruction      OB History    No data available       Home Medications    Prior to Admission medications   Medication Sig Start Date End Date Taking? Authorizing Provider  aspirin EC 81 MG tablet Take 81 mg by mouth daily.   Yes Historical Provider, MD  citalopram (CELEXA) 10 MG tablet Take 1 tablet (10 mg total) by mouth daily. Patient taking differently: Take 10 mg by mouth at bedtime.  01/04/14  Yes Thao P Le, DO  folic acid (FOLVITE) 800 MCG tablet Take 400 mcg by mouth daily.   Yes Historical Provider, MD  lisinopril-hydrochlorothiazide (PRINZIDE,ZESTORETIC) 20-12.5 MG tablet Take 1 tablet by mouth daily. 09/22/15   Blaklee Shores, MD    oxyCODONE-acetaminophen (PERCOCET) 5-325 MG tablet Take 1-2 tablets by mouth every 6 (six) hours as needed (for pain). 09/22/15   Paula Libra, MD    Family History Family History  Problem Relation Age of Onset  . Hypertension Mother   . Diabetes Father   . Hypertension Father   . Mental illness Father   . Cancer Maternal Grandmother   . Diabetes Maternal Grandmother   . Hypertension Maternal Grandmother   . Stroke Paternal Grandmother     Social History Social History  Substance Use Topics  . Smoking status: Never Smoker  . Smokeless tobacco: Never Used  . Alcohol use No     Allergies   Other   Review of Systems Review of Systems  Respiratory: Negative for shortness of breath.   Cardiovascular: Positive for chest pain. Negative for leg swelling.  Gastrointestinal: Negative for nausea and vomiting.  Neurological: Negative for weakness and numbness.  All other systems reviewed and are negative.    Physical Exam Updated Vital Signs BP (!) 149/105   Pulse 87   Temp 98.8 F (37.1 C) (Oral)   Resp 14   SpO2 100%   Physical Exam General: Well-developed, well-nourished female in no acute distress; appearance consistent with age of record HENT: normocephalic; atraumatic Eyes: pupils equal, round and reactive to light; extraocular muscles intact Neck: supple; pain reproduced with rotation or flexion of neck limiting ROM Heart:  regular rate and rhythm Lungs: clear to auscultation bilaterally Chest: Nontender Abdomen: soft; nondistended; nontender; no masses or hepatosplenomegaly; bowel sounds present Extremities: No deformity; full range of motion except left shoulder due to pain; pulses normal Neurologic: Awake, alert and oriented; motor function intact in all extremities and symmetric; no facial droop Skin: Warm and dry Psychiatric: Tearful   ED Treatments / Results   Nursing notes and vitals signs, including pulse oximetry, reviewed.  Summary of this  visit's results, reviewed by myself:  Labs:  Results for orders placed or performed during the hospital encounter of 09/21/15 (from the past 24 hour(s))  Comprehensive metabolic panel     Status: Abnormal   Collection Time: 09/22/15 12:25 AM  Result Value Ref Range   Sodium 138 135 - 145 mmol/L   Potassium 3.3 (L) 3.5 - 5.1 mmol/L   Chloride 106 101 - 111 mmol/L   CO2 26 22 - 32 mmol/L   Glucose, Bld 140 (H) 65 - 99 mg/dL   BUN 8 6 - 20 mg/dL   Creatinine, Ser 1.61 0.44 - 1.00 mg/dL   Calcium 9.0 8.9 - 09.6 mg/dL   Total Protein 7.3 6.5 - 8.1 g/dL   Albumin 3.9 3.5 - 5.0 g/dL   AST 21 15 - 41 U/L   ALT 18 14 - 54 U/L   Alkaline Phosphatase 37 (L) 38 - 126 U/L   Total Bilirubin 0.7 0.3 - 1.2 mg/dL   GFR calc non Af Amer >60 >60 mL/min   GFR calc Af Amer >60 >60 mL/min   Anion gap 6 5 - 15  CBC with Differential/Platelet     Status: Abnormal   Collection Time: 09/22/15 12:25 AM  Result Value Ref Range   WBC 8.5 4.0 - 10.5 K/uL   RBC 4.74 3.87 - 5.11 MIL/uL   Hemoglobin 12.1 12.0 - 15.0 g/dL   HCT 04.5 40.9 - 81.1 %   MCV 77.8 (L) 78.0 - 100.0 fL   MCH 25.5 (L) 26.0 - 34.0 pg   MCHC 32.8 30.0 - 36.0 g/dL   RDW 91.4 78.2 - 95.6 %   Platelets 271 150 - 400 K/uL   Neutrophils Relative % 53 %   Neutro Abs 4.5 1.7 - 7.7 K/uL   Lymphocytes Relative 36 %   Lymphs Abs 3.0 0.7 - 4.0 K/uL   Monocytes Relative 7 %   Monocytes Absolute 0.6 0.1 - 1.0 K/uL   Eosinophils Relative 3 %   Eosinophils Absolute 0.3 0.0 - 0.7 K/uL   Basophils Relative 1 %   Basophils Absolute 0.0 0.0 - 0.1 K/uL  D-dimer, quantitative (not at [facility])     Status: None   Collection Time: 09/22/15 12:25 AM  Result Value Ref Range   D-Dimer, Quant <0.27 0.00 - 0.50 ug/mL-FEU  I-stat troponin, ED (not at [facility])     Status: None   Collection Time: 09/22/15 12:33 AM  Result Value Ref Range   Troponin i, poc 0.00 0.00 - 0.08 ng/mL   Comment 3            Imaging Studies: Dg Chest 2 View  Result Date:  09/22/2015 CLINICAL DATA:  Acute left chest pain. EXAM: CHEST  2 VIEW COMPARISON:  11/08/2011 FINDINGS: Upper limits normal heart size noted. There is no evidence of focal airspace disease, pulmonary edema, suspicious pulmonary nodule/mass, pleural effusion, or pneumothorax. No acute bony abnormalities are identified. IMPRESSION: No active cardiopulmonary disease. Electronically Signed   By: Harmon Pier  M.D.   On: 09/22/2015 01:36     EKG Interpretation  Date/Time:  Thursday September 21 2015 22:41:39 EDT Ventricular Rate:  90 PR Interval:    QRS Duration: 86 QT Interval:  339 QTC Calculation: 415 R Axis:   54 Text Interpretation:  Sinus rhythm Probable left atrial enlargement Borderline T wave abnormalities Baseline wander in lead(s) II III aVL aVF V3 V4 V5 V6 No significant change was found Confirmed by Jaecob Lowden  MD, Jonny RuizJOHN ([id number]) on 09/22/2015 12:19:00 AM       Procedures (including critical care time)  Medications Ordered in ED Medications  ondansetron (ZOFRAN-ODT) disintegrating tablet 8 mg (not administered)  HYDROmorphone (DILAUDID) injection 2 mg (not administered)    Final Clinical Impressions(s) / ED Diagnoses   Final diagnoses:  Cervical radiculopathy  Hypertension not at goal   I personally performed the services described in this documentation, which was scribed in my presence. The recorded information has been reviewed and is accurate.    Paula LibraJohn Ravenna Legore, MD 09/22/15 [phone]

## 2016-07-29 ENCOUNTER — Encounter (HOSPITAL_COMMUNITY): Payer: Self-pay | Admitting: Emergency Medicine

## 2016-07-29 ENCOUNTER — Emergency Department (HOSPITAL_COMMUNITY)
Admission: EM | Admit: 2016-07-29 | Discharge: 2016-07-29 | Disposition: A | Discharge status: Home / Self Care | Payer: BLUE CROSS/BLUE SHIELD | Attending: Emergency Medicine | Admitting: Emergency Medicine

## 2016-07-29 DIAGNOSIS — I10 Essential (primary) hypertension: Secondary | ICD-10-CM | POA: Insufficient documentation

## 2016-07-29 DIAGNOSIS — K0889 Other specified disorders of teeth and supporting structures: Secondary | ICD-10-CM | POA: Diagnosis not present

## 2016-07-29 DIAGNOSIS — Z79899 Other long term (current) drug therapy: Secondary | ICD-10-CM | POA: Insufficient documentation

## 2016-07-29 DIAGNOSIS — Z7982 Long term (current) use of aspirin: Secondary | ICD-10-CM | POA: Diagnosis not present

## 2016-07-29 MED ORDER — HYDROCODONE-ACETAMINOPHEN 5-325 MG PO TABS: 1.0000 | ORAL_TABLET | ORAL | 0 refills | Status: DC | PRN

## 2016-07-29 MED ORDER — ACETAMINOPHEN 325 MG PO TABS
650.0000 mg | ORAL_TABLET | Freq: Once | ORAL | Status: AC
  Administered 2016-07-29: 650 mg via ORAL
  Filled 2016-07-29: qty 2

## 2016-07-29 MED ORDER — IBUPROFEN 600 MG PO TABS: 600.0000 mg | ORAL_TABLET | Freq: Three times a day (TID) | ORAL | 0 refills | Status: DC | PRN

## 2016-07-29 NOTE — ED Triage Notes (Signed)
Pt states she has a broke tooth on the bottom right   Pt states she was chewing gum this morning around 3am and the pain increased  Pt states she woke up this morning and the pain is worse
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
## 2016-07-29 NOTE — Discharge Instructions (Signed)
Please call your dental team for follow-up

## 2016-07-29 NOTE — ED Notes (Signed)
Pt given another heat pack.

## 2016-12-09 ENCOUNTER — Other Ambulatory Visit: Payer: Self-pay | Admitting: Family Medicine

## 2016-12-09 DIAGNOSIS — R52 Pain, unspecified: Secondary | ICD-10-CM

## 2016-12-27 ENCOUNTER — Ambulatory Visit
Admission: RE | Admit: 2016-12-27 | Discharge: 2016-12-27 | Disposition: A | Discharge status: Home / Self Care | Payer: Managed Care, Other (non HMO) | Source: Ambulatory Visit | Attending: Family Medicine | Admitting: Family Medicine

## 2016-12-27 DIAGNOSIS — R52 Pain, unspecified: Secondary | ICD-10-CM

## 2017-03-17 ENCOUNTER — Emergency Department (HOSPITAL_COMMUNITY): Payer: BLUE CROSS/BLUE SHIELD

## 2017-03-17 ENCOUNTER — Encounter (HOSPITAL_COMMUNITY): Payer: Self-pay | Admitting: Emergency Medicine

## 2017-03-17 ENCOUNTER — Emergency Department (HOSPITAL_COMMUNITY)
Admission: EM | Admit: 2017-03-17 | Discharge: 2017-03-17 | Disposition: A | Discharge status: Home / Self Care | Payer: BLUE CROSS/BLUE SHIELD | Attending: Emergency Medicine | Admitting: Emergency Medicine

## 2017-03-17 DIAGNOSIS — Z79899 Other long term (current) drug therapy: Secondary | ICD-10-CM | POA: Insufficient documentation

## 2017-03-17 DIAGNOSIS — J111 Influenza due to unidentified influenza virus with other respiratory manifestations: Secondary | ICD-10-CM

## 2017-03-17 DIAGNOSIS — J112 Influenza due to unidentified influenza virus with gastrointestinal manifestations: Secondary | ICD-10-CM | POA: Insufficient documentation

## 2017-03-17 DIAGNOSIS — I10 Essential (primary) hypertension: Secondary | ICD-10-CM | POA: Insufficient documentation

## 2017-03-17 DIAGNOSIS — Z7982 Long term (current) use of aspirin: Secondary | ICD-10-CM | POA: Diagnosis not present

## 2017-03-17 DIAGNOSIS — R509 Fever, unspecified: Secondary | ICD-10-CM | POA: Diagnosis present

## 2017-03-17 LAB — I-STAT CG4 LACTIC ACID, ED: LACTIC ACID, VENOUS: 3.19 mmol/L — AB (ref 0.5–1.9)

## 2017-03-17 MED ORDER — SODIUM CHLORIDE 0.9 % IV BOLUS (SEPSIS)
1000.0000 mL | Freq: Once | INTRAVENOUS | Status: AC
  Administered 2017-03-17: 1000 mL via INTRAVENOUS

## 2017-03-17 MED ORDER — KETOROLAC TROMETHAMINE 30 MG/ML IJ SOLN
30.0000 mg | Freq: Once | INTRAMUSCULAR | Status: AC
  Administered 2017-03-17: 30 mg via INTRAVENOUS
  Filled 2017-03-17: qty 1

## 2017-03-17 MED ORDER — ACETAMINOPHEN 325 MG PO TABS
650.0000 mg | ORAL_TABLET | Freq: Once | ORAL | Status: AC | PRN
  Administered 2017-03-17: 650 mg via ORAL
  Filled 2017-03-17: qty 2

## 2017-03-17 MED ORDER — OSELTAMIVIR PHOSPHATE 75 MG PO CAPS: 75.0000 mg | ORAL_CAPSULE | Freq: Two times a day (BID) | ORAL | 0 refills | Status: DC

## 2017-03-17 NOTE — ED Notes (Signed)
Spoke to provider, waiting on results of Chest X-ray before discharge.

## 2017-03-17 NOTE — ED Notes (Signed)
Bed: WA03 Expected date:  Expected time:  Means of arrival:  Comments: Triage 1 

## 2017-03-17 NOTE — ED Triage Notes (Signed)
Patient c/o fever and chest pain. tylenol and ibuprofen around 230p today.

## 2017-03-17 NOTE — ED Provider Notes (Signed)
[facility]-EMERGENCY DEPT Provider Note   CSN: [id number] Arrival date & time: 03/17/17  1646     History   Chief Complaint Chief Complaint  Patient presents with  . Fever  . Chest Pain    HPI [name] is a 37 y.o. female.  HPI Patient presents with fevers chills cough myalgias.  States she feels as if she is been hit by a train.  Started yesterday.  Has had decreased appetite but is been able to drink liquids.  Patient's husband was recently seen and diagnosed with flu in the ER.  Denies dysuria.  Has some nausea with vomiting.  No diarrhea.  Patient states he has a history of hypertension. Past Medical History:  Diagnosis Date  . Anxiety   . Hypertension     There are no active problems to display for this patient.   Past Surgical History:  Procedure Laterality Date  . CESAREAN SECTION    . right acl reconstruction      OB History    No data available       Home Medications    Prior to Admission medications   Medication Sig Start Date End Date Taking? Authorizing Provider  acetaminophen (TYLENOL) 500 MG tablet Take 500 mg by mouth every 6 (six) hours as needed for moderate pain.   Yes [provider]  amLODipine (NORVASC) 10 MG tablet Take 10 mg by mouth daily.  12/30/16  Yes [provider]  ibuprofen (ADVIL,MOTRIN) 200 MG tablet Take 400 mg by mouth every 6 (six) hours as needed for moderate pain.   Yes [provider]  losartan-hydrochlorothiazide (HYZAAR) 100-12.5 MG tablet Take 1 tablet by mouth daily.  12/09/16  Yes [provider]  Phenylephrine-Pheniramine-DM (THERAFLU COLD & COUGH PO) Take 30 mLs by mouth 2 (two) times daily as needed (cold symptoms).   Yes [provider]  aspirin EC 81 MG tablet Take 81 mg by mouth daily.    [provider]  citalopram (CELEXA) 10 MG tablet Take 1 tablet (10 mg total) by mouth daily. Patient not taking: Reported on 03/17/2017 01/04/14   Le,  Thao P, DO  folic acid (FOLVITE) 800 MCG tablet Take 400 mcg by mouth daily.    [provider]  HYDROcodone-acetaminophen (NORCO/VICODIN) 5-325 MG tablet Take 1 tablet by mouth every 4 (four) hours as needed for moderate pain. Patient not taking: Reported on 03/17/2017 07/29/16   Azalia Bilisampos, Kevin, MD  ibuprofen (ADVIL,MOTRIN) 600 MG tablet Take 1 tablet (600 mg total) by mouth every 8 (eight) hours as needed. Patient not taking: Reported on 03/17/2017 07/29/16   Azalia Bilisampos, Kevin, MD  losartan-hydrochlorothiazide (HYZAAR) 100-25 MG tablet Take 1 tablet by mouth daily. Patient not taking: Reported on 03/17/2017 09/22/15   Molpus, Jonny RuizJohn, MD  oseltamivir (TAMIFLU) 75 MG capsule Take 1 capsule (75 mg total) by mouth every 12 (twelve) hours. 03/17/17   Benjiman CorePickering, Gloria Lambertson, MD  oxyCODONE-acetaminophen (PERCOCET) 5-325 MG tablet Take 1-2 tablets by mouth every 6 (six) hours as needed (for pain). Patient not taking: Reported on 03/17/2017 09/22/15   Molpus, John, MD  lisinopril (PRINIVIL,ZESTRIL) 10 MG tablet Take 1 tablet (10 mg total) by mouth daily. Patient not taking: Reported on 01/04/2014 11/08/11 01/04/14  Ivonne Andrewammen, Peter, PA-C    Family History Family History  Problem Relation Age of Onset  . Hypertension Mother   . Diabetes Father   . Hypertension Father   . Mental illness Father   . Cancer Maternal Grandmother   .  Diabetes Maternal Grandmother   . Hypertension Maternal Grandmother   . Stroke Paternal Grandmother     Social History Social History   Tobacco Use  . Smoking status: Never Smoker  . Smokeless tobacco: Never Used  Substance Use Topics  . Alcohol use: No    Alcohol/week: 0.0 oz  . Drug use: No     Allergies   Other   Review of Systems Review of Systems  Constitutional: Positive for chills and fever.  HENT: Positive for congestion and sore throat.   Eyes: Negative for photophobia.  Respiratory: Positive for cough.   Cardiovascular: Negative for chest pain.    Gastrointestinal: Negative for abdominal pain.  Endocrine: Negative for polyuria.  Genitourinary: Negative for flank pain.  Musculoskeletal: Positive for myalgias.  Skin: Negative for rash.  Neurological: Negative for seizures.  Hematological: Negative for adenopathy.  Psychiatric/Behavioral: Negative for confusion.     Physical Exam Updated Vital Signs BP (!) 162/113 (BP Location: Right Arm)   Pulse (!) 115   Temp (!) 102.9 F (39.4 C) (Oral)   Resp 11   Ht 5\' 7"  (1.702 m)   Wt 97.5 kg (215 lb)   SpO2 100%   BMI 33.67 kg/m   Physical Exam  Constitutional: She appears well-developed and well-nourished.  HENT:  Head: Normocephalic.  Posterior pharyngeal erythema without exudate.  Eyes: Pupils are equal, round, and reactive to light.  Neck: Neck supple.  Cardiovascular:  Tachycardia  Pulmonary/Chest:  No wheezes rales or rhonchi.  Musculoskeletal:       Right lower leg: She exhibits no edema.       Left lower leg: She exhibits no edema.  Neurological: She is alert.  Skin: Skin is warm. Capillary refill takes less than 2 seconds.  Psychiatric: She has a normal mood and affect.     ED Treatments / Results  Labs (all labs ordered are listed, but only abnormal results are displayed) Labs Reviewed  I-STAT CG4 LACTIC ACID, ED - Abnormal; Notable for the following components:      Result Value   Lactic Acid, Venous 3.19 (*)    All other components within normal limits    EKG  EKG Interpretation None       Radiology No results found.  Procedures Procedures (including critical care time)  Medications Ordered in ED Medications  sodium chloride 0.9 % bolus 1,000 mL (1,000 mLs Intravenous New Bag/Given 03/17/17 1839)  sodium chloride 0.9 % bolus 1,000 mL (1,000 mLs Intravenous New Bag/Given 03/17/17 1839)  acetaminophen (TYLENOL) tablet 650 mg (not administered)  ketorolac (TORADOL) 30 MG/ML injection 30 mg (30 mg Intravenous Given 03/17/17 1841)     Initial  Impression / Assessment and Plan / ED Course  I have reviewed the triage vital signs and the nursing notes.  Pertinent labs & imaging results that were available during my care of the patient were reviewed by me and considered in my medical decision making (see chart for details).     Patient is likely influenza.  Fevers.  Some tachycardia but is improved with fluids.  Patient has chills.  Husband has been treated for influenza.  Tolerate orals and will discharge home with Tamiflu.  She is within the first 48 hours of the symptoms.  Final Clinical Impressions(s) / ED Diagnoses   Final diagnoses:  Influenza    ED Discharge Orders        Ordered    oseltamivir (TAMIFLU) 75 MG capsule  Every 12 hours  03/17/17 Letitia Libra, MD 03/17/17 1925

## 2020-09-18 ENCOUNTER — Emergency Department (HOSPITAL_COMMUNITY): Payer: 59

## 2020-09-18 ENCOUNTER — Emergency Department: Payer: Self-pay

## 2020-09-18 ENCOUNTER — Emergency Department (HOSPITAL_COMMUNITY)
Admission: EM | Admit: 2020-09-18 | Discharge: 2020-09-18 | Disposition: A | Discharge status: Home / Self Care | Payer: 59 | Attending: Emergency Medicine | Admitting: Emergency Medicine

## 2020-09-18 ENCOUNTER — Encounter (HOSPITAL_COMMUNITY): Payer: Self-pay | Admitting: *Deleted

## 2020-09-18 DIAGNOSIS — Y92410 Unspecified street and highway as the place of occurrence of the external cause: Secondary | ICD-10-CM | POA: Diagnosis not present

## 2020-09-18 DIAGNOSIS — Z7982 Long term (current) use of aspirin: Secondary | ICD-10-CM | POA: Diagnosis not present

## 2020-09-18 DIAGNOSIS — I10 Essential (primary) hypertension: Secondary | ICD-10-CM | POA: Insufficient documentation

## 2020-09-18 DIAGNOSIS — Z79899 Other long term (current) drug therapy: Secondary | ICD-10-CM | POA: Insufficient documentation

## 2020-09-18 DIAGNOSIS — S6991XA Unspecified injury of right wrist, hand and finger(s), initial encounter: Secondary | ICD-10-CM | POA: Diagnosis present

## 2020-09-18 DIAGNOSIS — S52331A Displaced oblique fracture of shaft of right radius, initial encounter for closed fracture: Secondary | ICD-10-CM | POA: Insufficient documentation
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
## 2020-09-19 ENCOUNTER — Encounter: Payer: Self-pay | Admitting: Orthopaedic Surgery

## 2020-09-19 ENCOUNTER — Other Ambulatory Visit: Payer: Self-pay

## 2020-09-19 ENCOUNTER — Ambulatory Visit (INDEPENDENT_AMBULATORY_CARE_PROVIDER_SITE_OTHER): Payer: 59 | Admitting: Orthopaedic Surgery

## 2020-09-19 DIAGNOSIS — S52321A Displaced transverse fracture of shaft of right radius, initial encounter for closed fracture: Secondary | ICD-10-CM | POA: Diagnosis not present

## 2020-09-19 DIAGNOSIS — S52309A Unspecified fracture of shaft of unspecified radius, initial encounter for closed fracture: Secondary | ICD-10-CM | POA: Insufficient documentation

## 2020-09-19 MED ORDER — OXYCODONE-ACETAMINOPHEN 5-325 MG PO TABS: 1.0000 | ORAL_TABLET | ORAL | 0 refills | Status: DC | PRN

## 2020-09-19 MED ORDER — IBUPROFEN 800 MG PO TABS: 800.0000 mg | ORAL_TABLET | Freq: Two times a day (BID) | ORAL | 0 refills | Status: DC | PRN
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
## 2020-09-20 ENCOUNTER — Telehealth: Payer: Self-pay | Admitting: Orthopaedic Surgery

## 2020-09-20 NOTE — Telephone Encounter (Signed)
Pt wants to know if she needs to stop taking ibuprofen before surgery?  CB [phone]

## 2020-09-20 NOTE — Telephone Encounter (Signed)
Please advise 

## 2020-09-20 NOTE — Telephone Encounter (Signed)
I called pt and advise... she stated understanding

## 2020-09-25 ENCOUNTER — Other Ambulatory Visit: Payer: Self-pay | Admitting: Orthopaedic Surgery

## 2020-09-25 DIAGNOSIS — S52321A Displaced transverse fracture of shaft of right radius, initial encounter for closed fracture: Secondary | ICD-10-CM

## 2020-09-25 HISTORY — PX: ORIF WRIST FRACTURE: SHX2133

## 2020-09-25 MED ORDER — OXYCODONE-ACETAMINOPHEN 5-325 MG PO TABS: 1.0000 | ORAL_TABLET | ORAL | 0 refills | Status: DC | PRN

## 2020-10-03 ENCOUNTER — Encounter: Payer: Self-pay | Admitting: Orthopaedic Surgery

## 2020-10-03 ENCOUNTER — Other Ambulatory Visit: Payer: Self-pay

## 2020-10-03 ENCOUNTER — Ambulatory Visit (INDEPENDENT_AMBULATORY_CARE_PROVIDER_SITE_OTHER): Payer: 59

## 2020-10-03 ENCOUNTER — Ambulatory Visit (INDEPENDENT_AMBULATORY_CARE_PROVIDER_SITE_OTHER): Payer: 59 | Admitting: Orthopaedic Surgery

## 2020-10-03 VITALS — BP 148/93 | Ht 67.0 in | Wt 219.0 lb

## 2020-10-03 DIAGNOSIS — S52321A Displaced transverse fracture of shaft of right radius, initial encounter for closed fracture: Secondary | ICD-10-CM

## 2020-10-03 MED ORDER — OXYCODONE-ACETAMINOPHEN 5-325 MG PO TABS: 1.0000 | ORAL_TABLET | ORAL | 0 refills | Status: DC | PRN

## 2020-10-03 NOTE — Progress Notes (Signed)
   Post-Op Visit Note   Patient: [name]           Date of Birth: Nov 11, 1980           MRN: [id number] Visit Date: 10/03/2020 PCP: Summey Moccasin, MD   Assessment & Plan: Postop ORIF radial shaft fracture.  She has some mild swelling in her fingers.  She has been using the sling some.  We will place her after Steri-Strip change in the long wrist splint she can remove for washing her hand or taking a shower.  Recheck 4 weeks.  Chief Complaint:  Chief Complaint  Patient presents with   Right Wrist - Routine Post Op    09/25/2020 ORIF right distal radius   Visit Diagnoses:  1. Closed displaced transverse fracture of shaft of right radius, initial encounter     Plan: Long Velcro wrist splint.  Percocet renewed.  Work slip given  RTW 10/04/20.     ROV 4 wks  Follow-Up Instructions: No follow-ups on file.   Orders:  Orders Placed This Encounter  Procedures   XR Wrist Complete Right   No orders of the defined types were placed in this encounter.   Imaging: No results found.  PMFS History: Patient Active Problem List   Diagnosis Date Noted   Closed fracture of radius, shaft 09/19/2020   Past Medical History:  Diagnosis Date   Anxiety    Hypertension     Family History  Problem Relation Age of Onset   Hypertension Mother    Diabetes Father    Hypertension Father    Mental illness Father    Cancer Maternal Grandmother    Diabetes Maternal Grandmother    Hypertension Maternal Grandmother    Stroke Paternal Grandmother     Past Surgical History:  Procedure Laterality Date   CESAREAN SECTION     right acl reconstruction     Social History   Occupational History   Not on file  Tobacco Use   Smoking status: Never   Smokeless tobacco: Never  Vaping Use   Vaping Use: Never used  Substance and Sexual Activity   Alcohol use: No    Alcohol/week: 0.0 standard drinks   Drug use: No   Sexual activity: Yes    Birth control/protection: None

## 2020-10-09 ENCOUNTER — Telehealth: Payer: Self-pay | Admitting: Orthopaedic Surgery

## 2020-10-09 NOTE — Telephone Encounter (Signed)
Pt states brace is causing a lot of pain. She says theres lots of bruising on the outside of her hand. She would like to know what her other options are? She don't know if she can make it a month in that brace.  CB [phone]

## 2020-10-31 ENCOUNTER — Encounter: Payer: Self-pay | Admitting: Orthopaedic Surgery

## 2020-10-31 ENCOUNTER — Other Ambulatory Visit: Payer: Self-pay

## 2020-10-31 ENCOUNTER — Ambulatory Visit (INDEPENDENT_AMBULATORY_CARE_PROVIDER_SITE_OTHER): Payer: 59 | Admitting: Orthopaedic Surgery

## 2020-10-31 ENCOUNTER — Ambulatory Visit (INDEPENDENT_AMBULATORY_CARE_PROVIDER_SITE_OTHER): Payer: 59

## 2020-10-31 VITALS — Ht 67.0 in | Wt 219.0 lb

## 2020-10-31 DIAGNOSIS — S52321A Displaced transverse fracture of shaft of right radius, initial encounter for closed fracture: Secondary | ICD-10-CM

## 2020-10-31 NOTE — Progress Notes (Signed)
   Post-Op Visit Note   Patient: [name]           Date of Birth: 12-23-1980           MRN: [id number] Visit Date: 10/31/2020 PCP: Batrez Moccasin, MD   Assessment & Plan: Post-ORIF radial shaft fracture.  She has 80% pronation supination.  We will set up for some hand therapy to work on wrist flexion extension finger flexion extension.  She lacks 1 cm touching distal palmar crease and should respond well with some therapy.  She asked about doing additional exercises on her own at home and we went over several.  She is back at work and I will recheck her again in 4 weeks.  Chief Complaint:  Chief Complaint  Patient presents with   Right Wrist - Follow-up    09/25/2020 ORIF right distal radius   Visit Diagnoses:  1. Closed displaced transverse fracture of shaft of right radius, initial encounter     Plan: Hand OT therapy for supination pronation wrist flexion extension finger flexion extension and strengthening.  No x-ray needed on return in 4 weeks.  Follow-Up Instructions: Return in about 4 weeks (around 11/28/2020).   Orders:  Orders Placed This Encounter  Procedures   XR Wrist 2 Views Right   Ambulatory referral to Occupational Therapy   No orders of the defined types were placed in this encounter.   Imaging: XR Wrist 2 Views Right  Result Date: 10/31/2020 2 view x-rays left forearm obtained and reviewed.  This shows radius plating.  Interval healing of the radius shaft fracture with old fracture line barely visible. Impression: Post left radial shaft ORIF with interval healing.   PMFS History: Patient Active Problem List   Diagnosis Date Noted   Closed fracture of radius, shaft 09/19/2020   Past Medical History:  Diagnosis Date   Anxiety    Hypertension     Family History  Problem Relation Age of Onset   Hypertension Mother    Diabetes Father    Hypertension Father    Mental illness Father    Cancer Maternal Grandmother    Diabetes Maternal  Grandmother    Hypertension Maternal Grandmother    Stroke Paternal Grandmother     Past Surgical History:  Procedure Laterality Date   CESAREAN SECTION     right acl reconstruction     Social History   Occupational History   Not on file  Tobacco Use   Smoking status: Never   Smokeless tobacco: Never  Vaping Use   Vaping Use: Never used  Substance and Sexual Activity   Alcohol use: No    Alcohol/week: 0.0 standard drinks   Drug use: No   Sexual activity: Yes    Birth control/protection: None

## 2020-11-07 ENCOUNTER — Encounter: Payer: Self-pay | Admitting: Occupational Therapy

## 2020-11-07 ENCOUNTER — Ambulatory Visit (INDEPENDENT_AMBULATORY_CARE_PROVIDER_SITE_OTHER): Payer: 59 | Admitting: Occupational Therapy

## 2020-11-07 ENCOUNTER — Other Ambulatory Visit: Payer: Self-pay

## 2020-11-07 DIAGNOSIS — R601 Generalized edema: Secondary | ICD-10-CM

## 2020-11-07 DIAGNOSIS — M25541 Pain in joints of right hand: Secondary | ICD-10-CM | POA: Diagnosis not present

## 2020-11-07 DIAGNOSIS — M6281 Muscle weakness (generalized): Secondary | ICD-10-CM

## 2020-11-07 DIAGNOSIS — M25641 Stiffness of right hand, not elsewhere classified: Secondary | ICD-10-CM

## 2020-11-07 DIAGNOSIS — R278 Other lack of coordination: Secondary | ICD-10-CM | POA: Diagnosis not present

## 2020-11-07 DIAGNOSIS — M25531 Pain in right wrist: Secondary | ICD-10-CM

## 2020-11-07 DIAGNOSIS — M25631 Stiffness of right wrist, not elsewhere classified: Secondary | ICD-10-CM
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
## 2020-11-07 NOTE — Patient Instructions (Addendum)
Flexor Tendon Gliding (Active Full Fist)    Straighten all fingers, then make a fist, bending all joints. Repeat _10___ times. Do __4-6__ sessions per day. Hold each exercise for 5-10 seconds.  Flexor Tendon Gliding (Active Hook Fist)    With fingers and knuckles straight, bend middle and tip joints. Do not bend large knuckles. Repeat ____ times. Do ____ sessions per day.  Flexor Tendon Gliding (Active Straight Fist)    Start with fingers straight. Bend knuckles and middle joints. Keep fingertip joints straight to touch base of palm. Repeat ____ times. Do ____ sessions per day.    Opposition (Active)    Touch tip of thumb to nail tip of each finger in turn, making an "O" shape. Repeat ____ times. Do ____ sessions per day.  Use a cold pack for edema control and the glove. Use you hand for daily activity to decrease swelling.

## 2020-11-09 ENCOUNTER — Encounter: Payer: Self-pay | Admitting: Occupational Therapy

## 2020-11-09 ENCOUNTER — Other Ambulatory Visit: Payer: Self-pay

## 2020-11-09 ENCOUNTER — Ambulatory Visit: Payer: 59 | Admitting: Occupational Therapy

## 2020-11-09 DIAGNOSIS — M25631 Stiffness of right wrist, not elsewhere classified: Secondary | ICD-10-CM

## 2020-11-09 DIAGNOSIS — M25531 Pain in right wrist: Secondary | ICD-10-CM

## 2020-11-09 DIAGNOSIS — M6281 Muscle weakness (generalized): Secondary | ICD-10-CM

## 2020-11-09 DIAGNOSIS — R601 Generalized edema: Secondary | ICD-10-CM | POA: Diagnosis not present

## 2020-11-09 DIAGNOSIS — M25541 Pain in joints of right hand: Secondary | ICD-10-CM | POA: Diagnosis not present

## 2020-11-09 DIAGNOSIS — R278 Other lack of coordination: Secondary | ICD-10-CM

## 2020-11-09 DIAGNOSIS — M25641 Stiffness of right hand, not elsewhere classified: Secondary | ICD-10-CM

## 2020-11-09 NOTE — Patient Instructions (Signed)
Wrist: Flexion    Rest arm with elbow on padded surface. Let wrist drop down. Apply gentle downward push with fingers of other hand. Hold ____ seconds. Repeat ____ times. Do ____ sessions per day. CAUTION: Stretch slowly and gently. Do not force joint. Activity: Rest chin on back of hand with elbow on firm surface.*  Wrist: Extension    Keep palm on table using other hand on top to assist. Lift elbow upward. Hold ____ seconds. Repeat ____ times. Do ____ sessions per day. CAUTION: Stretch slowly and gently. Do not force joint. Activity: Resting hand with palm on hip, move elbow out at side.*  Radial Deviation (Passive)    With wrist and palm on table, place other hand on top to keep steady while bringing elbow inward. Hold ____ seconds. Repeat ____ times. Do ____ sessions per day.  Ulnar Deviation (Passive)    With palm and wrist on table, place other hand on top to steady while bringing elbow outward. Hold ____ seconds. Repeat ____ times. Do ____ sessions per day.  Pronation (Passive)    Keep elbow bent at right angle and held firmly to side. Use other hand to turn forearm until palm faces downward. Hold ____ seconds. Repeat ____ times. Do ____ sessions per day.  Supination (Active)    With elbow held at right angle and kept at side, turn palm upward as far as possible. Hold ____ seconds. Repeat ____ times. Do ____ sessions per day. Activity: Use this motion when turning cards over.*  Scar Tissue Massage    Place pad of fingers on scar area. Apply steady downward pressure while moving in circular fashion and back and forth.  Repeat until entire scar has been covered. Massage for 5-10 min 1-2 times a day.  Massage from fingertips down past your wrist for edema (swelling) control.

## 2020-11-09 NOTE — Therapy (Signed)
[facility] [address] [place], [place], [address] Phone: [phone]   Fax:  [phone]  Occupational Therapy Treatment  Patient Details  Name: [name] MRN: [id number] Date of Birth: 01-02-1981 Referring Provider (OT): Dr Ophelia Charter   Encounter Date: 11/09/2020   OT End of Session - 11/09/20 0945     Visit Number 2    Number of Visits 13   Eval + 12 visits   Date for OT Re-Evaluation 12/19/20   Every 10 visits   Authorization Type [employer] - No Auth required    Progress Note Due on Visit 10    OT Start Time [phone]    OT Stop Time 0930    OT Time Calculation (min) 41 min    Activity Tolerance Patient tolerated treatment well    Behavior During Therapy [facility] for tasks assessed/performed             Past Medical History:  Diagnosis Date   Anxiety    Hypertension     Past Surgical History:  Procedure Laterality Date   CESAREAN SECTION     right acl reconstruction      There were no vitals filed for this visit.   Subjective Assessment - 11/09/20 0855     Subjective  Pt reports stiffness right hand/wrist, rates pain as 1-2/10.    Pertinent History HTN, Anxiety    Currently in Pain? Yes    Pain Score 2     Pain Location Hand    Pain Descriptors / Indicators Aching;Sore    Pain Type Acute pain    Pain Onset More than a month ago    Pain Frequency Intermittent    Multiple Pain Sites No                [facility] OT Assessment - 11/09/20 0001       Tone   Assessment Location Right Upper Extremity      ROM / Strength   AROM / PROM / Strength AROM   wrist     AROM   Overall AROM  Deficits    Overall AROM Comments Right wrist and hand with deficits.    AROM Assessment Site Wrist;Forearm      Strength   Right/Left Wrist Right    Right Wrist Flexion --   42   Right Wrist Extension --   12   Right Wrist Radial Deviation --   10   Right Wrist Ulnar Deviation --   10   Right/Left hand Right    Right Hand  Gross Grasp Impaired               OT Treatments/Exercises (OP) - 11/09/20 0001       ADLs   ADL Comments Pt educated in using right hand for daily activity related to bathing, dressing, holding items (cup, toothbrush etc), and work tasks as able.   Pt reports that she is doing her HEP and wearing her edema glove right hand)     Exercises   Exercises Wrist;Hand      Hand Exercises   Other Hand Exercises Reviewed HEP to include active tendon gliding & opposition ex's R hand Upgraded HEP to include A/AROM and gentle P/ROM right wrist for forearm pronation/supination, RD/UD, prayer stretch, wrist flexion/extension. Pt will hold each ex for 5-10 seconds x10 reps each a minimum of 4-6x/day. Exercises were reviewed and performed in the clinic today.    Other Hand Exercises Pt was also educated  in performing edema massage/retrograde massage and scar massage/desensitization techniques x10 min. Pt is w/ some hypersensitivity along her distal volar forearm scar. Densensitization and scar massage should assist in decreasing her symptoms.   R UE     Manual Therapy   Manual Therapy Edema management;Joint mobilization   Issued smaller edema control glove (size small) to allow for increased compression of right fingers and hand. Gentle edema control R/retrograde massage/scar massage & gentle P/ROM/joint mobilization wrist flex/exten, RD/UD           Wrist: Flexion    Rest arm with elbow on padded surface. Let wrist drop down. Apply gentle downward push with fingers of other hand. Hold ____ seconds. Repeat ____ times. Do ____ sessions per day. CAUTION: Stretch slowly and gently. Do not force joint. Activity: Rest chin on back of hand with elbow on firm surface.*  Wrist: Extension    Keep palm on table using other hand on top to assist. Lift elbow upward. Hold ____ seconds. Repeat ____ times. Do ____ sessions per day. CAUTION: Stretch slowly and gently. Do not force joint. Activity:  Resting hand with palm on hip, move elbow out at side.*   Radial Deviation (Passive)    With wrist and palm on table, place other hand on top to keep steady while bringing elbow inward. Hold ____ seconds. Repeat ____ times. Do ____ sessions per day.  Ulnar Deviation (Passive)    With palm and wrist on table, place other hand on top to steady while bringing elbow outward. Hold ____ seconds. Repeat ____ times. Do ____ sessions per day.  Pronation (Passive)    Keep elbow bent at right angle and held firmly to side. Use other hand to turn forearm until palm faces downward. Hold ____ seconds. Repeat ____ times. Do ____ sessions per day.  Supination (Active)    With elbow held at right angle and kept at side, turn palm upward as far as possible. Hold ____ seconds. Repeat ____ times. Do ____ sessions per day. Activity: Use this motion when turning cards over.*  Scar Tissue Massage    Place pad of fingers on scar area. Apply steady downward pressure while moving in circular fashion and back and forth.  Repeat until entire scar has been covered. Massage for 5-10 min 1-2 times a day.   Massage from fingertips down past your wrist for edema (swelling) control. Wear smaller edema glove as able.    OT Education - 11/09/20 0944     Education Details Review and performance of HEP, upgraded HEP for wrist and forearm ex's right. Edema control techniques and scar management R volar forearm.    Person(s) Educated Patient    Methods Explanation;Demonstration;Handout    Comprehension Verbalized understanding;Returned demonstration;Need further instruction              OT Short Term Goals - 11/09/20 0951       OT SHORT TERM GOAL #1   Title Pt to be Mod I initial HEP right wrist    Time 6    Period Weeks    Status On-going      OT SHORT TERM GOAL #2   Title Pt will be Mod I scar management and desensitization techniques R forearm/ORIFvolar scar    Time 6    Period Weeks     Status On-going      OT SHORT TERM GOAL #3   Title Pt will be Mod I edema control techniques and edema glove use as seen in clinic  Right hand    Time 6    Period Weeks    Status On-going    Target Date 12/19/20               OT Long Term Goals - 11/07/20 1418       OT LONG TERM GOAL #1   Title Pt will be Mod I upgraded HEP R hand/wrist and will be Mod I strengthening    Time 12    Period Weeks    Status New    Target Date 01/30/21      OT LONG TERM GOAL #2   Title Pt will demonstrate increased grip as seen by ability to flex fingers into palm w/ <1cm to [facility] right hand    Time 12    Period Weeks    Status New    Target Date 01/30/21      OT LONG TERM GOAL #3   Title Pt will report decreased pain with functional activity right hand as 3/10 or less during work related tasks    Time 12    Period Weeks    Status New    Target Date 01/30/21      OT LONG TERM GOAL #4   Title Pt will demonstrate functional grip strength right hand as 15# or greater via JAMAR dynamometer    Time 12    Period Weeks    Status New    Target Date 01/30/21                   Plan - 11/09/20 0946     Clinical Impression Statement Assessed wrist/forearm A/ROM. Reviewed and performed HEP as issued earlier this week and upgraded HEP to include A/AROM and gentle P/ROM right wrist flexion/extension, RD/UD, forearm pronation/supination, edema control techniques/retrograde massage, scar desensitization/massage. Also issued right small edema glove as pt reports that medium is now feeling not as compressive. Pt reporting decreased pain today as 1-2/10 right wrist. Cont toard PLOC and goals.    OT Occupational Profile and History Problem Focused Assessment - Including review of records relating to presenting problem    Occupational performance deficits (Please refer to evaluation for details): ADL's;Work    Body Structure / Function / Physical Skills ADL;Strength;Dexterity;Pain;Edema;UE  functional use;ROM;Scar mobility;Coordination;Flexibility;Mobility;Decreased knowledge of precautions;FMC    Rehab Potential Good    Clinical Decision Making Limited treatment options, no task modification necessary    Comorbidities Affecting Occupational Performance: May have comorbidities impacting occupational performance    Modification or Assistance to Complete Evaluation  No modification of tasks or assist necessary to complete eval    OT Frequency Other (comment)   Eval + 12 visits over 12 weeks   OT Duration Other (comment)   Eval + 12 visits over 12 weeks   OT Treatment/Interventions Self-care/ADL training;Fluidtherapy;Splinting;Therapeutic activities;Compression bandaging;Ultrasound;Therapeutic exercise;Scar mobilization;Cryotherapy;Passive range of motion;Manual Therapy;Patient/family education    Plan Review/perform HEP R wrist and forearm, scar management and desensitization. Light functional activity R hand/wrist (large pegs, graded clothes pins etc).    Consulted and Agree with Plan of Care Patient             Patient will benefit from skilled therapeutic intervention in order to improve the following deficits and impairments:   Body Structure / Function / Physical Skills: ADL, Strength, Dexterity, Pain, Edema, UE functional use, ROM, Scar mobility, Coordination, Flexibility, Mobility, Decreased knowledge of precautions, FMC       Visit Diagnosis: Pain in joint of right hand  Pain in  right wrist  Other lack of coordination  Generalized edema  Stiffness of right hand, not elsewhere classified  Stiffness of right wrist, not elsewhere classified  Muscle weakness (generalized)    Problem List Patient Active Problem List   Diagnosis Date Noted   Closed fracture of radius, shaft 09/19/2020    Letasha Kershaw Dionicio Stall, OT/L 11/09/2020, 9:53 AM  [facility] Physical Therapy [address] [place], [place], [address] Phone: [phone]   Fax:   [phone]  Name: [name] MRN: [id number] Date of Birth: 04/29/1980

## 2020-11-14 ENCOUNTER — Ambulatory Visit: Payer: 59 | Admitting: Occupational Therapy

## 2020-11-14 ENCOUNTER — Encounter: Payer: Self-pay | Admitting: Occupational Therapy

## 2020-11-14 ENCOUNTER — Other Ambulatory Visit: Payer: Self-pay

## 2020-11-14 DIAGNOSIS — R601 Generalized edema: Secondary | ICD-10-CM | POA: Diagnosis not present

## 2020-11-14 DIAGNOSIS — R278 Other lack of coordination: Secondary | ICD-10-CM | POA: Diagnosis not present

## 2020-11-14 DIAGNOSIS — M25541 Pain in joints of right hand: Secondary | ICD-10-CM | POA: Diagnosis not present

## 2020-11-14 DIAGNOSIS — M25531 Pain in right wrist: Secondary | ICD-10-CM

## 2020-11-14 DIAGNOSIS — M25631 Stiffness of right wrist, not elsewhere classified: Secondary | ICD-10-CM

## 2020-11-14 DIAGNOSIS — M25641 Stiffness of right hand, not elsewhere classified: Secondary | ICD-10-CM

## 2020-11-14 DIAGNOSIS — M6281 Muscle weakness (generalized): Secondary | ICD-10-CM

## 2020-11-14 NOTE — Therapy (Signed)
[facility] [address] [place], [place], [address] Phone: [phone]   Fax:  [phone]  Occupational Therapy Treatment  Patient Details  Name: [name] MRN: [id number] Date of Birth: 1980/08/11 Referring Provider (OT): Dr Ophelia Charter   Encounter Date: 11/14/2020   OT End of Session - 11/14/20 0956     Visit Number 3    Number of Visits 13   Eval + 12 visits   Date for OT Re-Evaluation 12/19/20   Every 10 visits   Authorization Type [employer] - No Auth required    Authorization - Visit Number 3    Progress Note Due on Visit 10    OT Start Time [phone]    OT Stop Time 0935    OT Time Calculation (min) 40 min    Activity Tolerance Patient tolerated treatment well    Behavior During Therapy [facility] for tasks assessed/performed             Past Medical History:  Diagnosis Date   Anxiety    Hypertension     Past Surgical History:  Procedure Laterality Date   CESAREAN SECTION     right acl reconstruction      There were no vitals filed for this visit.   Subjective Assessment - 11/14/20 0858     Subjective  Pt reports stiffness over the weekend right wrist and rates pain as 2/10.    Pertinent History HTN, Anxiety    Patient Stated Goals Increased A/ROM, strength, "Get hand and wrist back"    Currently in Pain? Yes    Pain Score 2     Pain Location Wrist    Pain Orientation Right    Pain Descriptors / Indicators Aching;Tightness;Throbbing    Pain Type Acute pain    Pain Radiating Towards Right wrist and hand    Pain Onset More than a month ago    Pain Frequency Intermittent    Multiple Pain Sites No               OT Treatments/Exercises (OP) - 11/14/20 0001       ADLs   ADL Comments Reviewed using right hand for all ADL's and functional activity, examples discussed. Pt states that she can hold a cup with right hand and a wide grip toothbrush, when brushing her teeth, at this time.      Exercises   Exercises  Wrist;Hand      Wrist Exercises   Other wrist exercises Reviewed HEP right hand/wrist/digits/forearm and edema control techniques. Pt ed to cont HEP as issued previously and add putty ex's as issued today.      Hand Exercises   Other Hand Exercises UBE x8 min rotating forward and back for hand and wrist flexibility and A/ROM level 1.0. Large grip pegs into pegboard with three point pinch R UE x26 pegs. Min vc's to relax shoulder secondary to compensation.    Other Hand Exercises Reviewed retrograde massage and edema control techniques and scar desensitization. Pt is wearing her edema glove at home and is also doing ice baths PRN.      Sensation Exercises   Desensitization Right volar forearm scar massage & desensitization techniques in conjunction with edema techniques today (see note below). Pt reports less scar hypersensitivity as compared to last visit - distal scar is still more sensitive than proximal noted. Focus on this area should assist in decreasing these symptoms.      Manual Therapy   Manual Therapy Edema management;Joint  mobilization    Edema Management Retrograde massage R wrist and hand secondary to dorsal wrist and finger edema x10 min            Grip Strengthening (Resistive Putty)    Squeeze putty using thumb and all fingers. Repeat ____ times. Do ____ sessions per day.  Lateral Pinch Strengthening (Resistive Putty)    Squeeze between thumb and side of each finger in turn. Repeat ____ times. Do ____ sessions per day.  Three Jaw Chuck Pinch Strengthening (Resistive Putty)    Pull putty, using thumb, index and middle fingers. Repeat ____ times. Do ____ sessions per day.  Decrease # of reps and/or times per day with increased pain or soreness. Continue edema control techniques and cryotherapy/ice pack/ice bath PRN.    OT Education - 11/14/20 0955     Education Details Review and performance of HEP, upgraded HEP for yellow putty for grip/pinches right.  Edema control techniques and scar management/desensitization R volar forearm.    Person(s) Educated Patient    Methods Explanation;Demonstration;Handout    Comprehension Verbalized understanding;Returned demonstration;Need further instruction              OT Short Term Goals - 11/14/20 1004       OT SHORT TERM GOAL #1   Title Pt to be Mod I initial HEP right wrist    Time 6    Period Weeks    Status On-going    Target Date 12/19/20      OT SHORT TERM GOAL #2   Title Pt will be Mod I scar management and desensitization techniques R forearm/ORIFvolar scar    Time 6    Period Weeks    Status On-going      OT SHORT TERM GOAL #3   Title Pt will be Mod I edema control techniques and edema glove use as seen in clinic Right hand    Time 6    Period Weeks    Status On-going    Target Date 12/19/20               OT Long Term Goals - 11/07/20 1418       OT LONG TERM GOAL #1   Title Pt will be Mod I upgraded HEP R hand/wrist and will be Mod I strengthening    Time 12    Period Weeks    Status New    Target Date 01/30/21      OT LONG TERM GOAL #2   Title Pt will demonstrate increased grip as seen by ability to flex fingers into palm w/ <1cm to [facility] right hand    Time 12    Period Weeks    Status New    Target Date 01/30/21      OT LONG TERM GOAL #3   Title Pt will report decreased pain with functional activity right hand as 3/10 or less during work related tasks    Time 12    Period Weeks    Status New    Target Date 01/30/21      OT LONG TERM GOAL #4   Title Pt will demonstrate functional grip strength right hand as 15# or greater via JAMAR dynamometer    Time 12    Period Weeks    Status New    Target Date 01/30/21               Plan - 11/14/20 0957     Clinical Impression Statement Pt reports increased pain  and stiffness over last weekend with "change in weather". Pt able to use UBE today and reported less stiffness after bilateral UE and  recriprical UE arm, hand and wrist movement. Pt reports hand stiffness is limiting factor. Pt is perofrming HEP and edema control techniques at home as instructed. Upgraded HEP to include yellow putty for grip and lateral/3-point pinch strengthening to assist with increased functional use and light strengthening of  right hand.    OT Occupational Profile and History Problem Focused Assessment - Including review of records relating to presenting problem    Occupational performance deficits (Please refer to evaluation for details): ADL's;Work    Body Structure / Function / Physical Skills ADL;Strength;Dexterity;Pain;Edema;UE functional use;ROM;Scar mobility;Coordination;Flexibility;Mobility;Decreased knowledge of precautions;FMC    Rehab Potential Good    Clinical Decision Making Limited treatment options, no task modification necessary    Comorbidities Affecting Occupational Performance: May have comorbidities impacting occupational performance    Modification or Assistance to Complete Evaluation  No modification of tasks or assist necessary to complete eval    OT Frequency Other (comment)   Eval + 12 visits over 12 weeks   OT Duration Other (comment)   Eval +12 visits over 12 weeks   OT Treatment/Interventions Self-care/ADL training;Fluidtherapy;Splinting;Therapeutic activities;Compression bandaging;Ultrasound;Therapeutic exercise;Scar mobilization;Cryotherapy;Passive range of motion;Manual Therapy;Patient/family education    Plan UBE, graded clothes pins/large pegs, upgrade scar desensitization home program, review HEP & upgrade as appropriate.    Consulted and Agree with Plan of Care Patient             Patient will benefit from skilled therapeutic intervention in order to improve the following deficits and impairments:   Body Structure / Function / Physical Skills: ADL, Strength, Dexterity, Pain, Edema, UE functional use, ROM, Scar mobility, Coordination, Flexibility, Mobility, Decreased  knowledge of precautions, [facility]       Visit Diagnosis: Pain in joint of right hand  Pain in right wrist  Other lack of coordination  Generalized edema  Stiffness of right hand, not elsewhere classified  Stiffness of right wrist, not elsewhere classified  Muscle weakness (generalized)    Problem List Patient Active Problem List   Diagnosis Date Noted   Closed fracture of radius, shaft 09/19/2020    Atziri Zubiate Dionicio Stall, OT/L 11/14/2020, 10:08 AM  [facility] [address] [place], [place], [address] Phone: [phone]   Fax:  [phone]  Name: MAGALY POLLINA MRN: [id number] Date of Birth: 02-06-1981

## 2020-11-14 NOTE — Patient Instructions (Signed)
Grip Strengthening (Resistive Putty)    Squeeze putty using thumb and all fingers. Repeat ____ times. Do ____ sessions per day.  Lateral Pinch Strengthening (Resistive Putty)    Squeeze between thumb and side of each finger in turn. Repeat ____ times. Do ____ sessions per day.  Three Jaw Chuck Pinch Strengthening (Resistive Putty)    Pull putty, using thumb, index and middle fingers. Repeat ____ times. Do ____ sessions per day.  Copyright  V[employer]. All rights reserved.     

## 2020-11-16 ENCOUNTER — Encounter: Payer: 59 | Admitting: Occupational Therapy

## 2020-11-21 ENCOUNTER — Encounter: Payer: Self-pay | Admitting: Occupational Therapy

## 2020-11-21 ENCOUNTER — Ambulatory Visit: Payer: 59 | Admitting: Occupational Therapy

## 2020-11-21 ENCOUNTER — Other Ambulatory Visit: Payer: Self-pay

## 2020-11-21 DIAGNOSIS — R601 Generalized edema: Secondary | ICD-10-CM

## 2020-11-21 DIAGNOSIS — M25641 Stiffness of right hand, not elsewhere classified: Secondary | ICD-10-CM

## 2020-11-21 DIAGNOSIS — M25541 Pain in joints of right hand: Secondary | ICD-10-CM | POA: Diagnosis not present

## 2020-11-21 DIAGNOSIS — R278 Other lack of coordination: Secondary | ICD-10-CM | POA: Diagnosis not present

## 2020-11-21 DIAGNOSIS — M25631 Stiffness of right wrist, not elsewhere classified: Secondary | ICD-10-CM

## 2020-11-21 DIAGNOSIS — M6281 Muscle weakness (generalized): Secondary | ICD-10-CM

## 2020-11-21 DIAGNOSIS — M25531 Pain in right wrist: Secondary | ICD-10-CM | POA: Diagnosis not present

## 2020-11-21 NOTE — Therapy (Signed)
[facility] [address] [place], [place], [address] Phone: [phone]   Fax:  [phone]  Occupational Therapy Treatment  Patient Details  Name: [name] MRN: [id number] Date of Birth: 11/11/1980 Referring Provider (OT): Dr Ophelia Charter   Encounter Date: 11/21/2020   OT End of Session - 11/21/20 1049     Visit Number 4    Number of Visits 13   Eval +12 visits   Date for OT Re-Evaluation 12/19/20   Every 10 visits   Authorization Type [employer] - No Auth required    Authorization - Visit Number 4    Progress Note Due on Visit 10    OT Start Time 0845    OT Stop Time [phone]    OT Time Calculation (min) 43 min    Activity Tolerance Patient tolerated treatment well    Behavior During Therapy [facility] for tasks assessed/performed             Past Medical History:  Diagnosis Date   Anxiety    Hypertension     Past Surgical History:  Procedure Laterality Date   CESAREAN SECTION     right acl reconstruction      There were no vitals filed for this visit.   Subjective Assessment - 11/21/20 0848     Subjective  Pt reports that she "over did it" last weekend and has noticed increased swelling and edema right hand and wrist.    Pertinent History HTN, Anxiety    Patient Stated Goals Increased A/ROM, strength, "Get hand and wrist back"    Currently in Pain? Yes    Pain Score 5     Pain Location Hand    Pain Orientation Right    Pain Descriptors / Indicators Aching;Throbbing;Tightness    Pain Type Acute pain    Pain Radiating Towards Right wrist and hand    Pain Onset More than a month ago    Pain Frequency Intermittent    Aggravating Factors  Over use - doing her hair    Pain Relieving Factors ice baths, edema glove    Multiple Pain Sites No               OT Treatments/Exercises (OP) - 11/21/20 0001       ADLs   ADL Comments Pt reports increased pain in right wrist after doing her hair last week. She was using her  right hand for about 8 hours while doing her hair. Encouranged to continue use during light daily activities Taking breaks for edema control, glove/ice baths etc. and avoid extended time for tasks, instead breaking up tasks and doing a little at a time. Pt reports that she is still not driving, she was educated that there should not be any contraindications to her driving but she can contact her MD if she has any concerns. She is not taking any pain medication at this time.   Encourage light functional use for daily activity     Exercises   Exercises Wrist;Hand      Wrist Exercises   Other wrist exercises Reviewed HEP right hand/wrist/digits/forearm and edema control techniques. Pt ed to cont HEP as issued previously, including yellow putty ex's for grip and pinches.      Hand Exercises   Other Hand Exercises UBE x8 min rotating forward and back for hand and wrist flexibility and A/ROM level 1.0. Graded clothes pins (red, yellow, blue) with three point pinch R UE x6 x4 sets. Tendon gliding ex's,  prayer stretch etc. Min vc's to relax shoulder secondary to compensation.    Other Hand Exercises Reviewed/performed retrograde massage and edema control techniques and scar desensitization. Pt is wearing her edema glove at home and is also doing ice baths PRN.      Manual Therapy   Manual Therapy Edema management;Joint mobilization    Edema Management Retrograde massage R wrist and hand secondary to dorsal wrist and finger edema x10 min    Joint Mobilization Gentle P/ROM right wrist for flexion, extension, pro/supination, RD/UD, composite finger flexion.              OT Education - 11/21/20 1048     Education Details Review and performance of HEP, yellow putty for grip/pinches right. Edema control techniques and scar management/desensitization R volar forearm. Break up tasks into smaller componenets to not over do it.    Person(s) Educated Patient    Methods Explanation;Demonstration;Handout     Comprehension Verbalized understanding;Returned demonstration;Need further instruction              OT Short Term Goals - 11/21/20 1054       OT SHORT TERM GOAL #1   Title Pt to be Mod I initial HEP right wrist    Time 6    Period Weeks    Status Achieved    Target Date 12/19/20      OT SHORT TERM GOAL #2   Title Pt will be Mod I scar management and desensitization techniques R forearm/ORIFvolar scar    Time 6    Period Weeks    Status Achieved    Target Date 12/19/20      OT SHORT TERM GOAL #3   Title Pt will be Mod I edema control techniques and edema glove use as seen in clinic Right hand    Time 6    Period Weeks    Status Achieved    Target Date 12/19/20               OT Long Term Goals - 11/07/20 1418       OT LONG TERM GOAL #1   Title Pt will be Mod I upgraded HEP R hand/wrist and will be Mod I strengthening    Time 12    Period Weeks    Status New    Target Date 01/30/21      OT LONG TERM GOAL #2   Title Pt will demonstrate increased grip as seen by ability to flex fingers into palm w/ <1cm to [facility] right hand    Time 12    Period Weeks    Status New    Target Date 01/30/21      OT LONG TERM GOAL #3   Title Pt will report decreased pain with functional activity right hand as 3/10 or less during work related tasks    Time 12    Period Weeks    Status New    Target Date 01/30/21      OT LONG TERM GOAL #4   Title Pt will demonstrate functional grip strength right hand as 15# or greater via JAMAR dynamometer    Time 12    Period Weeks    Status New    Target Date 01/30/21              Plan - 11/21/20 1050     Clinical Impression Statement Pt reports increased pain and edema in right hand/wrist after "over doing it last weekend" Pt reports that she  was working on her hair for an 8 hour session and felt increased stiffness/soreness/edema for last couple of days. Pt should benefit from breaking up functional activity taking care to take  breaks PRN. Cont w/ edema control techniques, scar management and light functional use overall.    OT Occupational Profile and History Problem Focused Assessment - Including review of records relating to presenting problem    Occupational performance deficits (Please refer to evaluation for details): ADL's;Work    Body Structure / Function / Physical Skills ADL;Strength;Dexterity;Pain;Edema;UE functional use;ROM;Scar mobility;Coordination;Flexibility;Mobility;Decreased knowledge of precautions;FMC    Clinical Decision Making Limited treatment options, no task modification necessary    Comorbidities Affecting Occupational Performance: May have comorbidities impacting occupational performance    Modification or Assistance to Complete Evaluation  No modification of tasks or assist necessary to complete eval    OT Frequency Other (comment)   +12 visits over 12 weeks   OT Duration Other (comment)   Eval + 12 visits over 12 weeks   OT Treatment/Interventions Self-care/ADL training;Fluidtherapy;Splinting;Therapeutic activities;Compression bandaging;Ultrasound;Therapeutic exercise;Scar mobilization;Cryotherapy;Passive range of motion;Manual Therapy;Patient/family education    Plan UBE, graded clothes pins/large pegs, upgrade scar desensitization home program, review HEP & upgrade as appropriate.    Consulted and Agree with Plan of Care Patient             Patient will benefit from skilled therapeutic intervention in order to improve the following deficits and impairments:   Body Structure / Function / Physical Skills: ADL, Strength, Dexterity, Pain, Edema, UE functional use, ROM, Scar mobility, Coordination, Flexibility, Mobility, Decreased knowledge of precautions, [facility]       Visit Diagnosis: Pain in joint of right hand  Pain in right wrist  Other lack of coordination  Generalized edema  Stiffness of right hand, not elsewhere classified  Stiffness of right wrist, not elsewhere  classified  Muscle weakness (generalized)    Problem List Patient Active Problem List   Diagnosis Date Noted   Closed fracture of radius, shaft 09/19/2020    Rashunda Passon Dionicio Stall, OT/L 11/21/2020, 10:56 AM  [facility] [address] [place], [place], [address] Phone: [phone]   Fax:  [phone]  Name: ZAFIRA MUNOS MRN: [id number] Date of Birth: 1980/03/06

## 2020-11-23 ENCOUNTER — Encounter: Payer: Self-pay | Admitting: Occupational Therapy

## 2020-11-23 ENCOUNTER — Ambulatory Visit: Payer: 59 | Admitting: Occupational Therapy

## 2020-11-23 ENCOUNTER — Other Ambulatory Visit: Payer: Self-pay

## 2020-11-23 DIAGNOSIS — M25531 Pain in right wrist: Secondary | ICD-10-CM

## 2020-11-23 DIAGNOSIS — M25541 Pain in joints of right hand: Secondary | ICD-10-CM | POA: Diagnosis not present

## 2020-11-23 DIAGNOSIS — M25631 Stiffness of right wrist, not elsewhere classified: Secondary | ICD-10-CM

## 2020-11-23 DIAGNOSIS — M25641 Stiffness of right hand, not elsewhere classified: Secondary | ICD-10-CM

## 2020-11-23 DIAGNOSIS — R278 Other lack of coordination: Secondary | ICD-10-CM

## 2020-11-23 DIAGNOSIS — R601 Generalized edema: Secondary | ICD-10-CM | POA: Diagnosis not present

## 2020-11-23 DIAGNOSIS — M6281 Muscle weakness (generalized): Secondary | ICD-10-CM

## 2020-11-23 NOTE — Therapy (Signed)
[facility] [address] [place], [place], [address] Phone: [phone]   Fax:  [phone]  Occupational Therapy Treatment  Patient Details  Name: [name] MRN: [id number] Date of Birth: [birth year] Referring Provider (OT): Dr Ophelia Charter   Encounter Date: 11/23/2020   OT End of Session - 11/23/20 1020     Visit Number 5    Number of Visits 13   Eval +12 visits   Date for OT Re-Evaluation 12/19/20   Every 10 visits   Authorization Type [employer] - No Auth required    Authorization - Visit Number 5    Progress Note Due on Visit 10    OT Start Time [phone]    OT Stop Time 1015    OT Time Calculation (min) 38 min    Activity Tolerance Patient tolerated treatment well;No increased pain    Behavior During Therapy WFL for tasks assessed/performed             Past Medical History:  Diagnosis Date   Anxiety    Hypertension     Past Surgical History:  Procedure Laterality Date   CESAREAN SECTION     right acl reconstruction      There were no vitals filed for this visit.   Subjective Assessment - 11/23/20 0941     Subjective  Pt reports decreased pain in right wrist since last visit rating pain as 4/10.    Pertinent History HTN, Anxiety    Patient Stated Goals Increased A/ROM, strength, "Get hand and wrist back"    Currently in Pain? Yes    Pain Score 4     Pain Location Wrist    Pain Orientation Right    Pain Descriptors / Indicators Aching;Throbbing;Tightness    Pain Type Acute pain    Pain Radiating Towards Right wrist and hand    Pain Onset More than a month ago    Pain Frequency Intermittent    Multiple Pain Sites No               OT Treatments/Exercises (OP) - 11/23/20 0001       ADLs   ADL Comments Pt reports Mod I ADL's with difficulty secondary to edema and strength at this time. Pt is using cryotherapy and edema control techniques at home. Discussed neutral wrist tech & d/c of UD, secondary to ECU  tenderness, likely due to overuse last weekend, per pt report. Difficulty cutting food, grip and twist etc.    ADL Education Given Yes   Cont light functional use right hand. Edema control techniques R UE     Exercises   Exercises Wrist;Hand      Wrist Exercises   Other wrist exercises Reviewed HEP right hand/wrist/digits/forearm and edema control techniques. Pt ed to cont HEP as issued previously, including yellow putty ex's for grip and pinches.    Other wrist exercises Large grip pegs x24 placing into pegboard and removing with R x1 set each   Discussed light functional use of RUE & d/c of UD ex's as pt with sesnsitivity at ECU area noted. Likely due to overuse last weekend as reported by pt.     Hand Exercises   Other Hand Exercises UBE x8 min rotating forward and back for hand and wrist flexibility and A/ROM level 1.0.  Tendon gliding ex's etc. Min vc's to relax shoulder secondary to compensation.during large grip peg placement.      Modalities   Modalities Fluidotherapy  RUE Fluidotherapy   Number Minutes Fluidotherapy 10 Minutes    RUE Fluidotherapy Location Hand;Wrist;Forearm    Comments While performing A/ROM to right wrist, hand and forearm, tendon gliding, as well as desensitization to scar proximal to volar wrist.      Manual Therapy   Manual Therapy Edema management;Joint mobilization    Edema Management Retrograde & cross friction massage R wrist and hand secondary to dorsal wrist and finger edema & ECU tenderness x10 min    Joint Mobilization Gentle P/ROM right wrist for flexion, extension, pro/supination, RD/UD, composite finger flexion.               OT Education - 11/23/20 1019     Education Details Review and performance of HEP. Edema control techniques and scar management/desensitization R volar forearm. D/c UD per reports of pain at ECU likely due to overuse last weekend per pt report. Break up tasks into smaller componenets to not over do it.    Person(s)  Educated Patient    Methods Explanation;Demonstration;Handout    Comprehension Verbalized understanding;Returned demonstration;Need further instruction              OT Short Term Goals - 11/21/20 1054       OT SHORT TERM GOAL #1   Title Pt to be Mod I initial HEP right wrist    Time 6    Period Weeks    Status Achieved    Target Date 12/19/20      OT SHORT TERM GOAL #2   Title Pt will be Mod I scar management and desensitization techniques R forearm/ORIFvolar scar    Time 6    Period Weeks    Status Achieved    Target Date 12/19/20      OT SHORT TERM GOAL #3   Title Pt will be Mod I edema control techniques and edema glove use as seen in clinic Right hand    Time 6    Period Weeks    Status Achieved    Target Date 12/19/20               OT Long Term Goals - 11/07/20 1418       OT LONG TERM GOAL #1   Title Pt will be Mod I upgraded HEP R hand/wrist and will be Mod I strengthening    Time 12    Period Weeks    Status New    Target Date 01/30/21      OT LONG TERM GOAL #2   Title Pt will demonstrate increased grip as seen by ability to flex fingers into palm w/ <1cm to [facility] right hand    Time 12    Period Weeks    Status New    Target Date 01/30/21      OT LONG TERM GOAL #3   Title Pt will report decreased pain with functional activity right hand as 3/10 or less during work related tasks    Time 12    Period Weeks    Status New    Target Date 01/30/21      OT LONG TERM GOAL #4   Title Pt will demonstrate functional grip strength right hand as 15# or greater via JAMAR dynamometer    Time 12    Period Weeks    Status New    Target Date 01/30/21               Plan - 11/23/20 1020     Clinical Impression Statement  Pt with cont edema but decreased pain in right wrist (rating 4/10). Pt should benefit from cont edema control tech's and d/c of UD as pt reporting ECU tenderness/pain since over doing it last weekend when doing her hair. Examples of  neutral wrist discussed and light functional use for ADL and work related tasks.    OT Occupational Profile and History Problem Focused Assessment - Including review of records relating to presenting problem    Occupational performance deficits (Please refer to evaluation for details): ADL's;Work    Body Structure / Function / Physical Skills ADL;Strength;Dexterity;Pain;Edema;UE functional use;ROM;Scar mobility;Coordination;Flexibility;Mobility;Decreased knowledge of precautions;[facility]    Rehab Potential Good    Clinical Decision Making Limited treatment options, no task modification necessary    Comorbidities Affecting Occupational Performance: May have comorbidities impacting occupational performance    Modification or Assistance to Complete Evaluation  No modification of tasks or assist necessary to complete eval    OT Frequency Other (comment)   Eval + 12 visits   OT Duration Other (comment)   12 visits over 12 weeks   OT Treatment/Interventions Self-care/ADL training;Fluidtherapy;Splinting;Therapeutic activities;Compression bandaging;Ultrasound;Therapeutic exercise;Scar mobilization;Cryotherapy;Passive range of motion;Manual Therapy;Patient/family education    Plan Consider decreasing to 1x/week, light functional use and review/progression of HEP, scar and edema management R wrist.    Consulted and Agree with Plan of Care Patient             Patient will benefit from skilled therapeutic intervention in order to improve the following deficits and impairments:   Body Structure / Function / Physical Skills: ADL, Strength, Dexterity, Pain, Edema, UE functional use, ROM, Scar mobility, Coordination, Flexibility, Mobility, Decreased knowledge of precautions, [facility]       Visit Diagnosis: Pain in joint of right hand  Pain in right wrist  Other lack of coordination  Generalized edema  Stiffness of right hand, not elsewhere classified  Stiffness of right wrist, not elsewhere  classified  Muscle weakness (generalized)    Problem List Patient Active Problem List   Diagnosis Date Noted   Closed fracture of radius, shaft 09/19/2020    Sugar Vanzandt Dionicio Stall, OT/L 11/23/2020, 10:26 AM  [facility] [address] [place], [place], [address] Phone: [phone]   Fax:  [phone]  Name: GABRIAL POPPELL MRN: [id number] Date of Birth: 1980-11-04

## 2020-11-28 ENCOUNTER — Encounter: Payer: 59 | Admitting: Occupational Therapy

## 2020-11-30 ENCOUNTER — Other Ambulatory Visit: Payer: Self-pay

## 2020-11-30 ENCOUNTER — Encounter: Payer: Self-pay | Admitting: Occupational Therapy

## 2020-11-30 ENCOUNTER — Ambulatory Visit: Payer: 59 | Admitting: Occupational Therapy

## 2020-11-30 DIAGNOSIS — R278 Other lack of coordination: Secondary | ICD-10-CM

## 2020-11-30 DIAGNOSIS — M6281 Muscle weakness (generalized): Secondary | ICD-10-CM

## 2020-11-30 DIAGNOSIS — M25631 Stiffness of right wrist, not elsewhere classified: Secondary | ICD-10-CM

## 2020-11-30 DIAGNOSIS — M25531 Pain in right wrist: Secondary | ICD-10-CM

## 2020-11-30 DIAGNOSIS — M25541 Pain in joints of right hand: Secondary | ICD-10-CM | POA: Diagnosis not present

## 2020-11-30 DIAGNOSIS — R601 Generalized edema: Secondary | ICD-10-CM | POA: Diagnosis not present

## 2020-11-30 DIAGNOSIS — M25641 Stiffness of right hand, not elsewhere classified: Secondary | ICD-10-CM

## 2020-11-30 NOTE — Patient Instructions (Signed)
Pronation / Supination (Resistive)    Hold hammer weighing ____ ounces and rotate palm up and down. Keep elbow flexed at side and wrist straight. Repeat ____ times. Do ____ sessions per day.  Copyright  VHI. All rights reserved.

## 2020-11-30 NOTE — Therapy (Signed)
[facility] [address] [place], [place], [address] Phone: [phone]   Fax:  [phone]  Occupational Therapy Treatment  Patient Details  Name: [name] MRN: [id number] Date of Birth: 27-May-1980 Referring Provider (OT): Dr Ophelia Charter   Encounter Date: 11/30/2020   OT End of Session - 11/30/20 1355     Visit Number 6    Number of Visits 13   Eval +12 visits   Date for OT Re-Evaluation 12/19/20   Every 10 visits   Authorization Type [employer] - No Auth required    Authorization - Visit Number 6    Progress Note Due on Visit 10    OT Start Time [phone]    OT Stop Time 1021    OT Time Calculation (min) 38 min    Activity Tolerance Patient tolerated treatment well;No increased pain    Behavior During Therapy WFL for tasks assessed/performed             Past Medical History:  Diagnosis Date   Anxiety    Hypertension     Past Surgical History:  Procedure Laterality Date   CESAREAN SECTION     right acl reconstruction      There were no vitals filed for this visit.   Subjective Assessment - 11/30/20 0958     Subjective  Pt reports decreased pain with avoidance of UD ex's. She rates her pain as 2/10.    Pertinent History HTN, Anxiety    Patient Stated Goals Increased A/ROM, strength, "Get hand and wrist back"    Currently in Pain? Yes    Pain Score 2     Pain Location Wrist    Pain Orientation Right    Pain Descriptors / Indicators Aching;Throbbing    Pain Type Acute pain    Pain Radiating Towards Right wrist and hand    Pain Onset More than a month ago    Pain Frequency Intermittent    Multiple Pain Sites No                [facility] OT Assessment - 11/30/20 0001       Hand Function   Right Hand Grip (lbs) 28.2    Right Hand Lateral Pinch 9.5 lbs    Right Hand 3 Point Pinch 11 lbs                OT Treatments/Exercises (OP) - 11/30/20 0001       ADLs   ADL Comments Pt reports Mod I ADL's with  difficulty secondary to edema and strength at this time. Pt is using cryotherapy and edema control techniques at home. Pt is using neutral wrist tech & d/c of UD, secondary to ECU tenderness, she has decreassed pain, per pt report. Difficulty cutting food, grip and twist etc. Pt reports difficulty with typing at work,    ADL Education Given Yes   Cont light functional use right hand. Edema control techniques R UE     Exercises   Exercises Wrist;Hand      Wrist Exercises   Other wrist exercises Added hammer for forearm supination and pronation    Other wrist exercises Increased resistance to red putty for grip and pinches x10 reps 1-2x/day.      Hand Exercises   Other Hand Exercises UBE x8 min rotating forward and back for hand and wrist flexibility and A/ROM level 1.0. Tendon gliding ex's etc    Other Hand Exercises [facility] ex's with small pegs and pegboard  and nut/bolts. 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 Pt reporting increased independence with overall use R hand.      Manual Therapy   Manual Therapy Edema management;Other (comment)   scar management/desensitization   Edema Management Verbal review of edema control and scar desensitization techniques              Hold hammer weighing __16__ ounces and rotate palm up and down. Keep elbow flexed at side and wrist straight. Repeat __10 each direction__ times. Do _4-6___ sessions per day.  Copyright  VHI. All rights reserved.    OT Education - 11/30/20 1351     Education Details Cont HEP, functional use at home and work, edema control, gentle strengthening    Person(s) Educated Patient    Methods Explanation;Demonstration;Handout    Comprehension Verbalized understanding;Returned demonstration;Need further instruction              OT Short Term Goals - 11/21/20 1054       OT SHORT TERM GOAL #1   Title Pt to be Mod I initial HEP right wrist    Time 6    Period Weeks    Status Achieved     Target Date 12/19/20      OT SHORT TERM GOAL #2   Title Pt will be Mod I scar management and desensitization techniques R forearm/ORIFvolar scar    Time 6    Period Weeks    Status Achieved    Target Date 12/19/20      OT SHORT TERM GOAL #3   Title Pt will be Mod I edema control techniques and edema glove use as seen in clinic Right hand    Time 6    Period Weeks    Status Achieved    Target Date 12/19/20               OT Long Term Goals - 11/07/20 1418       OT LONG TERM GOAL #1   Title Pt will be Mod I upgraded HEP R hand/wrist and will be Mod I strengthening    Time 12    Period Weeks    Status New    Target Date 01/30/21      OT LONG TERM GOAL #2   Title Pt will demonstrate increased grip as seen by ability to flex fingers into palm w/ <1cm to [facility] right hand    Time 12    Period Weeks    Status New    Target Date 01/30/21      OT LONG TERM GOAL #3   Title Pt will report decreased pain with functional activity right hand as 3/10 or less during work related tasks    Time 12    Period Weeks    Status New    Target Date 01/30/21      OT LONG TERM GOAL #4   Title Pt will demonstrate functional grip strength right hand as 15# or greater via JAMAR dynamometer    Time 12    Period Weeks    Status New    Target Date 01/30/21                   Plan - 11/30/20 1356     Clinical Impression Statement Pt making excellent gains with functional use and strength in R hand. Volar scar remain sensitive to palpation however pt is doing desensitization ex's as part of her home program.  OT Occupational Profile and History Problem Focused Assessment - Including review of records relating to presenting problem    Occupational performance deficits (Please refer to evaluation for details): ADL's;Work    Body Structure / Function / Physical Skills ADL;Strength;Dexterity;Pain;Edema;UE functional use;ROM;Scar mobility;Coordination;Flexibility;Mobility;Decreased  knowledge of precautions;[facility]    Rehab Potential Good    Clinical Decision Making Limited treatment options, no task modification necessary    Comorbidities Affecting Occupational Performance: May have comorbidities impacting occupational performance    Modification or Assistance to Complete Evaluation  No modification of tasks or assist necessary to complete eval    OT Frequency Other (comment)   Eval +12 visits   OT Duration Other (comment)    OT Treatment/Interventions Self-care/ADL training;Fluidtherapy;Splinting;Therapeutic activities;Compression bandaging;Ultrasound;Therapeutic exercise;Scar mobilization;Cryotherapy;Passive range of motion;Manual Therapy;Patient/family education    Plan Decrease to 1x/week, cont with strengthening and functional use, scar management R wrist.    Consulted and Agree with Plan of Care Patient             Patient will benefit from skilled therapeutic intervention in order to improve the following deficits and impairments:   Body Structure / Function / Physical Skills: ADL, Strength, Dexterity, Pain, Edema, UE functional use, ROM, Scar mobility, Coordination, Flexibility, Mobility, Decreased knowledge of precautions, [facility]       Visit Diagnosis: Pain in joint of right hand  Pain in right wrist  Other lack of coordination  Generalized edema  Stiffness of right hand, not elsewhere classified  Stiffness of right wrist, not elsewhere classified  Muscle weakness (generalized)    Problem List Patient Active Problem List   Diagnosis Date Noted   Closed fracture of radius, shaft 09/19/2020    Ronell Duffus Dionicio Stall, OT/L 11/30/2020, 4:38 PM  [facility] [address] [place], [place], [address] Phone: [phone]   Fax:  [phone]  Name: [name] MRN: [id number] Date of Birth: 1980/08/04

## 2020-12-01 ENCOUNTER — Encounter: Payer: Self-pay | Admitting: Orthopaedic Surgery

## 2020-12-01 ENCOUNTER — Ambulatory Visit: Payer: 59 | Admitting: Orthopaedic Surgery

## 2020-12-01 DIAGNOSIS — M25531 Pain in right wrist: Secondary | ICD-10-CM

## 2020-12-01 DIAGNOSIS — S52321S Displaced transverse fracture of shaft of right radius, sequela: Secondary | ICD-10-CM

## 2020-12-01 NOTE — Progress Notes (Signed)
   Post-Op Visit Note   Patient: [name]           Date of Birth: [birth year]           MRN: [id number] Visit Date: 12/01/2020 PCP: Velie Moccasin, MD   Assessment & Plan: Postop ORIF plating radial shaft fracture distal third.  She is doing hand therapy has noticed improvement.  Still has soreness over the ulnar styloid which I discussed with her is very typical.  She is got more than 50% of the wrist flexion extension we discussed she can continue to stress on this.  Her fracture is completely healed.  We discussed applying pressure waiting 3 to 5 minutes gradually letting it stretch post on flexion extension.  Supination present patient better.  She can continue in hand therapy for 2 times a week for a week or so more if she likes and cut back to once a week whenever she and the hand therapist feel like she is able with her own program to sustain improvement.  I will see her in 1 month for final visit.  Chief Complaint:  Chief Complaint  Patient presents with   Right Hand - Pain   Visit Diagnoses: No diagnosis found.  Plan: Return 1 month.  Follow-Up Instructions: Return in about 1 month (around 01/01/2021).   Orders:  No orders of the defined types were placed in this encounter.  No orders of the defined types were placed in this encounter.   Imaging: No results found.  PMFS History: Patient Active Problem List   Diagnosis Date Noted   Closed fracture of radius, shaft 09/19/2020   Past Medical History:  Diagnosis Date   Anxiety    Hypertension     Family History  Problem Relation Age of Onset   Hypertension Mother    Diabetes Father    Hypertension Father    Mental illness Father    Cancer Maternal Grandmother    Diabetes Maternal Grandmother    Hypertension Maternal Grandmother    Stroke Paternal Grandmother     Past Surgical History:  Procedure Laterality Date   CESAREAN SECTION     right acl reconstruction     Social History   Occupational  History   Not on file  Tobacco Use   Smoking status: Never   Smokeless tobacco: Never  Vaping Use   Vaping Use: Never used  Substance and Sexual Activity   Alcohol use: No    Alcohol/week: 0.0 standard drinks   Drug use: No   Sexual activity: Yes    Birth control/protection: None

## 2020-12-05 ENCOUNTER — Encounter: Payer: Self-pay | Admitting: Occupational Therapy

## 2020-12-05 ENCOUNTER — Other Ambulatory Visit: Payer: Self-pay

## 2020-12-05 ENCOUNTER — Ambulatory Visit: Payer: 59 | Admitting: Occupational Therapy

## 2020-12-05 DIAGNOSIS — R601 Generalized edema: Secondary | ICD-10-CM | POA: Diagnosis not present

## 2020-12-05 DIAGNOSIS — M25541 Pain in joints of right hand: Secondary | ICD-10-CM

## 2020-12-05 DIAGNOSIS — M25641 Stiffness of right hand, not elsewhere classified: Secondary | ICD-10-CM

## 2020-12-05 DIAGNOSIS — R278 Other lack of coordination: Secondary | ICD-10-CM | POA: Diagnosis not present

## 2020-12-05 DIAGNOSIS — M6281 Muscle weakness (generalized): Secondary | ICD-10-CM

## 2020-12-05 DIAGNOSIS — M25631 Stiffness of right wrist, not elsewhere classified: Secondary | ICD-10-CM

## 2020-12-05 DIAGNOSIS — M25531 Pain in right wrist: Secondary | ICD-10-CM | POA: Diagnosis not present

## 2020-12-05 NOTE — Therapy (Signed)
[name] Physical Therapy [address] [place], [place], [address] Phone: [phone]   Fax:  [phone]  Occupational Therapy Treatment  Patient Details  Name: [name] MRN: [id number] Date of Birth: [birth year] Referring Provider (OT): Dr Ophelia Charter   Encounter Date: 12/05/2020   OT End of Session - 12/05/20 0955     Visit Number 7    Number of Visits 13   Eval +12 visits   Date for OT Re-Evaluation 12/19/20   Every 10 visits   Authorization Type [employer] - No Auth required    Authorization - Visit Number 7    Progress Note Due on Visit 10    OT Start Time [phone]    OT Stop Time 0844    OT Time Calculation (min) 33 min    Activity Tolerance Patient tolerated treatment well;No increased pain    Behavior During Therapy WFL for tasks assessed/performed             Past Medical History:  Diagnosis Date   Anxiety    Hypertension     Past Surgical History:  Procedure Laterality Date   CESAREAN SECTION     right acl reconstruction      There were no vitals filed for this visit.   Subjective Assessment - 12/05/20 0818     Subjective  Pt reports no significant changes in symptoms R wrist/hand.    Pertinent History HTN, Anxiety    Patient Stated Goals Increased A/ROM, strength, "Get hand and wrist back"    Currently in Pain? No/denies    Pain Score 0-No pain    Multiple Pain Sites No                OT Treatments/Exercises (OP) - 12/05/20 0001       ADLs   ADL Comments Pt reports doing functional activity for ADL's at home and work tasks using bilateral hands. Is not driving yet. "I haven't tried yet" Pt reports that she can't pick up a heavy pot or pan at this time but is using right hand as assist when able. Pt encouraged verbally to use hand for all activtiy at home/work - examples provided and reviewed in clinic today.    ADL Education Given --   Cont edema control techniques at home/work PRN. Pt states still wearing  edema glove PRN R wrist     Exercises   Exercises Wrist;Hand      Wrist Exercises   Other wrist exercises Performed hammer for forearm supination and pronation    Other wrist exercises Reviewed putty ex's and HEP, added passive wrist stretch with 2 & 3# weight for wrist flexion/extension. Stress passive wall & prayer stretch for wrist extension/flexion (forearm resting on table placing weight in hand with left non-injured hand) stress hold end range during stretches. Pt states that MD also told her this at her last visit. Encouraged functional use for all activity at home/wrok.      Hand Exercises   Other Hand Exercises UBE x8 min rotating forward and back for hand and wrist flexibility and A/ROM level 1.0    Other Hand Exercises Pt reports using bilateral hands at work for [facility] tasks such as fixing glasses and using work tools.      Manual Therapy   Edema Management Pt arrived late to appointment today therefore, edema management reviewed verbally today.                OT Education - 12/05/20 4132  Education Details Cont HEP R UE, No limitations for R UE, use for all functional activity at home and work, Edema control PRN, Hold stretches longer for increased stretch - wall stretches, prayer stretch, added 2-3# wrist stretch at table for wrist flexion and extension. Pt reports that she has weights at home.    Person(s) Educated Patient    Methods Explanation;Demonstration;Handout    Comprehension Verbalized understanding;Returned demonstration;Need further instruction              OT Short Term Goals - 11/21/20 1054       OT SHORT TERM GOAL #1   Title Pt to be Mod I initial HEP right wrist    Time 6    Period Weeks    Status Achieved    Target Date 12/19/20      OT SHORT TERM GOAL #2   Title Pt will be Mod I scar management and desensitization techniques R forearm/ORIFvolar scar    Time 6    Period Weeks    Status Achieved    Target Date 12/19/20      OT SHORT  TERM GOAL #3   Title Pt will be Mod I edema control techniques and edema glove use as seen in clinic Right hand    Time 6    Period Weeks    Status Achieved    Target Date 12/19/20               OT Long Term Goals - 11/07/20 1418       OT LONG TERM GOAL #1   Title Pt will be Mod I upgraded HEP R hand/wrist and will be Mod I strengthening    Time 12    Period Weeks    Status New    Target Date 01/30/21      OT LONG TERM GOAL #2   Title Pt will demonstrate increased grip as seen by ability to flex fingers into palm w/ <1cm to [facility] right hand    Time 12    Period Weeks    Status New    Target Date 01/30/21      OT LONG TERM GOAL #3   Title Pt will report decreased pain with functional activity right hand as 3/10 or less during work related tasks    Time 12    Period Weeks    Status New    Target Date 01/30/21      OT LONG TERM GOAL #4   Title Pt will demonstrate functional grip strength right hand as 15# or greater via JAMAR dynamometer    Time 12    Period Weeks    Status New    Target Date 01/30/21               Plan - 12/05/20 0956     Clinical Impression Statement Pt should benfit from holding/increased time for stetch for end range wrist flexion and extension. Upgraded to 2 & 3# weights for wrist flexion and extension with forearm supported on table top. Pt also encouraged to perform wrist flex/exten & hold at wall as well as prayer stretches. "That's hard" per pt as she has been instructed in these in the past. Pt should experience increased wrist flexion/extension with increased functional use R wrist for all activity in conjunction with holding end range stretch for increased time.    OT Occupational Profile and History Problem Focused Assessment - Including review of records relating to presenting problem    Occupational performance  deficits (Please refer to evaluation for details): ADL's;Work    Body Structure / Function / Physical Skills  ADL;Strength;Dexterity;Pain;Edema;UE functional use;ROM;Scar mobility;Coordination;Flexibility;Mobility;Decreased knowledge of precautions;[facility]    Rehab Potential Good    Clinical Decision Making Limited treatment options, no task modification necessary    Comorbidities Affecting Occupational Performance: May have comorbidities impacting occupational performance    Modification or Assistance to Complete Evaluation  No modification of tasks or assist necessary to complete eval    OT Frequency Other (comment)   Eval +12 visits   OT Duration Other (comment)    OT Treatment/Interventions Self-care/ADL training;Fluidtherapy;Splinting;Therapeutic activities;Compression bandaging;Ultrasound;Therapeutic exercise;Scar mobilization;Cryotherapy;Passive range of motion;Manual Therapy;Patient/family education    Plan Continue appointments 1x/week, for stretch, strengthening and functional use, scar management/desensitization R wrist. Focus on LTG's    Consulted and Agree with Plan of Care Patient             Patient will benefit from skilled therapeutic intervention in order to improve the following deficits and impairments:   Body Structure / Function / Physical Skills: ADL, Strength, Dexterity, Pain, Edema, UE functional use, ROM, Scar mobility, Coordination, Flexibility, Mobility, Decreased knowledge of precautions, [facility]       Visit Diagnosis: Pain in joint of right hand  Pain in right wrist  Other lack of coordination  Generalized edema  Stiffness of right hand, not elsewhere classified  Stiffness of right wrist, not elsewhere classified  Muscle weakness (generalized)    Problem List Patient Active Problem List   Diagnosis Date Noted   Closed fracture of radius, shaft 09/19/2020    Breena Bevacqua Dionicio Stall, OT/L 12/05/2020, 10:05 AM  [facility] Physical Therapy [address] [place], [place], [address] Phone: [phone]   Fax:  [phone]  Name: KYLA DUFFY MRN: [id number] Date of Birth: May 06, 1980

## 2020-12-14 ENCOUNTER — Encounter: Payer: 59 | Admitting: Occupational Therapy

## 2020-12-19 ENCOUNTER — Encounter: Payer: Self-pay | Admitting: Occupational Therapy

## 2020-12-19 ENCOUNTER — Other Ambulatory Visit: Payer: Self-pay

## 2020-12-19 ENCOUNTER — Ambulatory Visit: Payer: 59 | Admitting: Occupational Therapy

## 2020-12-19 DIAGNOSIS — R601 Generalized edema: Secondary | ICD-10-CM

## 2020-12-19 DIAGNOSIS — M25531 Pain in right wrist: Secondary | ICD-10-CM | POA: Diagnosis not present

## 2020-12-19 DIAGNOSIS — R278 Other lack of coordination: Secondary | ICD-10-CM

## 2020-12-19 DIAGNOSIS — M6281 Muscle weakness (generalized): Secondary | ICD-10-CM

## 2020-12-19 DIAGNOSIS — M25631 Stiffness of right wrist, not elsewhere classified: Secondary | ICD-10-CM

## 2020-12-19 DIAGNOSIS — M25641 Stiffness of right hand, not elsewhere classified: Secondary | ICD-10-CM

## 2020-12-19 DIAGNOSIS — M25541 Pain in joints of right hand: Secondary | ICD-10-CM | POA: Diagnosis not present

## 2020-12-19 NOTE — Therapy (Signed)
[facility] Physical Therapy [address] [place], [place], [address] Phone: [phone]   Fax:  [phone]  Occupational Therapy Treatment  Patient Details  Name: [name] MRN: [id number] Date of Birth: May 14, 1980 Referring Provider (OT): Dr Ophelia Charter   Encounter Date: 12/19/2020   OT End of Session - 12/19/20 1011     Visit Number 8    Number of Visits 13   Eval + 12 visits   Date for OT Re-Evaluation 01/02/21    Authorization Type [employer] - No Auth required    Authorization - Visit Number 8    Progress Note Due on Visit 10    OT Start Time [phone]    OT Stop Time 0846    OT Time Calculation (min) 35 min    Activity Tolerance Patient tolerated treatment well;No increased pain    Behavior During Therapy WFL for tasks assessed/performed             Past Medical History:  Diagnosis Date   Anxiety    Hypertension     Past Surgical History:  Procedure Laterality Date   CESAREAN SECTION     right acl reconstruction      There were no vitals filed for this visit.   Subjective Assessment - 12/19/20 0815     Subjective  Pt reports decreased pain but reports stiffness in wrist flexion R wrist remains.    Pertinent History HTN, Anxiety    Patient Stated Goals Increased A/ROM, strength, "Get hand and wrist back"                OT Treatments/Exercises (OP) - 12/19/20 0001       ADLs   ADL Comments Pt reports that she can cut food with right hand. She states that she can wash dishes but it gets sore.    ADL Education Given Yes   Continue functional use right hand/wrist w/o limitations for work and homemaking activities and all ADL's.     Exercises   Exercises Wrist;Hand      Wrist Exercises   Other wrist exercises Performed weighted wrist stretch using 4# and 5# weights for right wrist flexion while supported on table. Wall stretches & prayer stretch for right wrist extension. Hammer for forearm pronation and supination.     Other wrist exercises Reviewed putty ex's and HEP, upgraded putty resistance to green for grip strengthening. Pt cautioned to not over do stretgthening ex and verbalized understanding in the clinic.      Hand Exercises   Other Hand Exercises UBE x8 min rotating forward and back for hand and wrist flexibility and A/ROM increase to level 2.0    Other Hand Exercises Wrist flexion with 5# weight over edge of table, wall stretch for wrist extension and hammer for forearm supination and pronation in clinc today. Pt is holding hammer at end at this point for increased stretch at end range.                  OT Education - 12/19/20 1010     Education Details Cont HEP without limitations, hold at end range. Increased putty resistance to green for strengthening, wall & prayer stretch for wrist extension and 4-5# weight for wrist flexion/stretch.    Person(s) Educated Patient    Methods Explanation;Demonstration;Handout    Comprehension Verbalized understanding;Returned demonstration;Need further instruction              OT Short Term Goals - 11/21/20 1054  OT SHORT TERM GOAL #1   Title Pt to be Mod I initial HEP right wrist    Time 6    Period Weeks    Status Achieved    Target Date 12/19/20      OT SHORT TERM GOAL #2   Title Pt will be Mod I scar management and desensitization techniques R forearm/ORIFvolar scar    Time 6    Period Weeks    Status Achieved    Target Date 12/19/20      OT SHORT TERM GOAL #3   Title Pt will be Mod I edema control techniques and edema glove use as seen in clinic Right hand    Time 6    Period Weeks    Status Achieved    Target Date 12/19/20               OT Long Term Goals - 12/19/20 1015       OT LONG TERM GOAL #1   Title Pt will be Mod I upgraded HEP R hand/wrist and will be Mod I strengthening    Time 12    Period Weeks    Status On-going    Target Date 01/30/21      OT LONG TERM GOAL #2   Title Pt will demonstrate  increased grip as seen by ability to flex fingers into palm w/ <1cm to [facility] right hand    Time 12    Period Weeks    Status Achieved    Target Date 01/30/21      OT LONG TERM GOAL #3   Title Pt will report decreased pain with functional activity right hand as 3/10 or less during work related tasks    Time 12    Period Weeks    Status On-going    Target Date 01/30/21      OT LONG TERM GOAL #4   Title Pt will demonstrate functional grip strength right hand as 15# or greater via JAMAR dynamometer    Time 12    Period Weeks    Status On-going    Target Date 01/30/21                   Plan - 12/19/20 1012     Clinical Impression Statement Pt should experience increased A/ROM with upgraded HEP R UE/wrist. Stressed no limitations at this time for ADL's and functional activity at [employer]. Pt reports some driving at this time. Assess A/ROM and LTG's next visit.    OT Occupational Profile and History Problem Focused Assessment - Including review of records relating to presenting problem    Occupational performance deficits (Please refer to evaluation for details): ADL's;Work    Body Structure / Function / Physical Skills ADL;Strength;Dexterity;Pain;Edema;UE functional use;ROM;Scar mobility;Coordination;Flexibility;Mobility;Decreased knowledge of precautions;[facility]    Rehab Potential Good    Clinical Decision Making Limited treatment options, no task modification necessary    Comorbidities Affecting Occupational Performance: May have comorbidities impacting occupational performance    Modification or Assistance to Complete Evaluation  No modification of tasks or assist necessary to complete eval    OT Frequency Other (comment)   Eval +12 visits   OT Duration Other (comment)    OT Treatment/Interventions Self-care/ADL training;Fluidtherapy;Splinting;Therapeutic activities;Compression bandaging;Ultrasound;Therapeutic exercise;Scar mobilization;Cryotherapy;Passive range of motion;Manual  Therapy;Patient/family education    Plan Focus on LTG's, assess A/ROM right wrist and grip etc. Decreased visits to 1x every other week as pt is Mod I HEP.    Consulted and Agree with  Plan of Care Patient             Patient will benefit from skilled therapeutic intervention in order to improve the following deficits and impairments:   Body Structure / Function / Physical Skills: ADL, Strength, Dexterity, Pain, Edema, UE functional use, ROM, Scar mobility, Coordination, Flexibility, Mobility, Decreased knowledge of precautions, [facility]       Visit Diagnosis: Pain in joint of right hand  Pain in right wrist  Other lack of coordination  Generalized edema  Stiffness of right hand, not elsewhere classified  Stiffness of right wrist, not elsewhere classified  Muscle weakness (generalized)    Problem List Patient Active Problem List   Diagnosis Date Noted   Closed fracture of radius, shaft 09/19/2020    Algie Westry Dionicio Stall, OT/L 12/19/2020, 11:46 AM  [facility] Physical Therapy [address] [place], [place], [address] Phone: [phone]   Fax:  [phone]  Name: [name] MRN: [id number] Date of Birth: 06/26/1980

## 2021-01-02 ENCOUNTER — Ambulatory Visit: Payer: 59 | Admitting: Occupational Therapy

## 2021-01-02 ENCOUNTER — Encounter: Payer: Self-pay | Admitting: Occupational Therapy

## 2021-01-02 ENCOUNTER — Ambulatory Visit: Payer: 59 | Admitting: Orthopaedic Surgery

## 2021-01-02 ENCOUNTER — Other Ambulatory Visit: Payer: Self-pay

## 2021-01-02 DIAGNOSIS — M25541 Pain in joints of right hand: Secondary | ICD-10-CM | POA: Diagnosis not present

## 2021-01-02 DIAGNOSIS — M25531 Pain in right wrist: Secondary | ICD-10-CM | POA: Diagnosis not present

## 2021-01-02 DIAGNOSIS — M6281 Muscle weakness (generalized): Secondary | ICD-10-CM

## 2021-01-02 DIAGNOSIS — R601 Generalized edema: Secondary | ICD-10-CM | POA: Diagnosis not present

## 2021-01-02 DIAGNOSIS — R278 Other lack of coordination: Secondary | ICD-10-CM | POA: Diagnosis not present

## 2021-01-02 DIAGNOSIS — M25641 Stiffness of right hand, not elsewhere classified: Secondary | ICD-10-CM

## 2021-01-02 DIAGNOSIS — M25631 Stiffness of right wrist, not elsewhere classified: Secondary | ICD-10-CM

## 2021-01-02 NOTE — Therapy (Signed)
[facility] Physical Therapy [address] [place], [place], [address] Phone: [phone]   Fax:  [phone]  Occupational Therapy Treatment  Patient Details  Name: [name] MRN: [id number] Date of Birth: 1980/04/09 Referring Provider (OT): Dr Ophelia Charter   Encounter Date: 01/02/2021   OT End of Session - 01/02/21 1634     Visit Number 9    Number of Visits 13   Eval + 12 visits   Date for OT Re-Evaluation 01/02/21    Authorization Type [employer] - No Auth required    Authorization - Visit Number 9    Progress Note Due on Visit 10    OT Start Time [phone]    OT Stop Time 0845    OT Time Calculation (min) 33 min    Activity Tolerance Patient tolerated treatment well;No increased pain    Behavior During Therapy WFL for tasks assessed/performed             Past Medical History:  Diagnosis Date   Anxiety    Hypertension     Past Surgical History:  Procedure Laterality Date   CESAREAN SECTION     right acl reconstruction      There were no vitals filed for this visit.   Subjective Assessment - 01/02/21 0815     Subjective  Pt reports return to driving. She states that she has "A knot" on her right dorsal hand that appeared about 1 week ago. She has been icing it. She has a PMH of ganglion cyst on her left wrist per her report   Pertinent History HTN, Anxiety    Patient Stated Goals Increased A/ROM, strength, "Get hand and wrist back"    Currently in Pain? Yes    Pain Score 3     Pain Location Wrist    Pain Orientation Right    Pain Descriptors / Indicators Aching;Throbbing    Pain Type Acute pain    Pain Radiating Towards R wrist and hand    Pain Onset More than a month ago    Pain Frequency Intermittent    Multiple Pain Sites No                [facility] OT Assessment - 01/02/21 0001       Strength   Right/Left Wrist Right    Right Wrist Flexion --   64   Right Wrist Extension --   63   Right Wrist Radial Deviation --   20    Right Wrist Ulnar Deviation --   25   Right/Left hand Right    Right Hand Grip (lbs) 42.8      Hand Function   Right Hand Gross Grasp Functional    Right Hand Grip (lbs) 42.9    Right Hand Lateral Pinch 12.5 lbs    Right Hand 3 Point Pinch 11 lbs                OT Treatments/Exercises (OP) - 01/02/21 0001       ADLs   ADL Comments Pt reports doing all ADL's using right as dominant hand. She denies any issues except with larger household duties (Ie: putting a new bed together and had difficulty lifting the box spring so asked for help). Pt reports that she is driving and doing all work related tasks except for changing lenses in plastic frames of glasses - due to decreased ability to pinch using right hand.    ADL Education Given Yes  Exercises   Exercises Wrist;Hand      Wrist Exercises   Other wrist exercises Performed weighted wrist stretch using 4# and 5# weights for right wrist flexion while supported on table. Wall stretches & prayer stretch for right wrist extension. Hammer for forearm pronation and supination.    Other wrist exercises Recommend decreased putty ex's with increased daily and work related activity.   Pt verbalized uderstanding in the clinic today.     Hand Exercises   Other Hand Exercises UBE x8 min rotating forward and back for hand and wrist flexibility and A/ROM increase to level 3.0 for generalized strengthening and A/ROM bilateral UE's/wrist    Other Hand Exercises Wrist flexion with 5# weight over edge of table, 5# for wrist extension and 16oz hammer for forearm supination and pronation in clinc today. Pt is holding hammer at end at this point for increased stretch      Manual Therapy   Edema Management Reviewed edem amanagement verbally with pt as she arrived 12 min late to appointment today.                    OT Education - 01/02/21 1634     Education Details Cont HEP without limitations, hold at end range. Increased putty  resistance to green for strengthening, wall & prayer stretch for wrist extension and 4-5# weight for wrist flexion/stretch.    Person(s) Educated Patient    Methods Explanation;Demonstration;Handout    Comprehension Verbalized understanding;Returned demonstration;Need further instruction              OT Short Term Goals - 11/21/20 1054       OT SHORT TERM GOAL #1   Title Pt to be Mod I initial HEP right wrist    Time 6    Period Weeks    Status Achieved    Target Date 12/19/20      OT SHORT TERM GOAL #2   Title Pt will be Mod I scar management and desensitization techniques R forearm/ORIFvolar scar    Time 6    Period Weeks    Status Achieved    Target Date 12/19/20      OT SHORT TERM GOAL #3   Title Pt will be Mod I edema control techniques and edema glove use as seen in clinic Right hand    Time 6    Period Weeks    Status Achieved    Target Date 12/19/20               OT Long Term Goals - 01/02/21 1640       OT LONG TERM GOAL #1   Title Pt will be Mod I upgraded HEP R hand/wrist and will be Mod I strengthening    Time 12    Period Weeks    Status On-going    Target Date 01/30/21      OT LONG TERM GOAL #2   Title Pt will demonstrate increased grip as seen by ability to flex fingers into palm w/ <1cm to [facility] right hand    Time 12    Period Weeks    Status Achieved    Target Date 01/30/21      OT LONG TERM GOAL #3   Title Pt will report decreased pain with functional activity right hand as 3/10 or less during work related tasks    Time 12    Period Weeks    Status Achieved    Target Date 01/30/21  OT LONG TERM GOAL #4   Title Pt will demonstrate functional grip strength right hand as 15# or greater via JAMAR dynamometer    Time 12    Period Weeks    Status Achieved    Target Date 01/30/21                   Plan - 01/02/21 1635     Clinical Impression Statement Pt is using right UE w/o limitations at home and work. She reported  some pain along her dorsal wrist over last week that has resolved per her report. Pt was reassessed today in clinc and D/c planning was begun. She continues to make slow steady progress with A/ROM and strength. Pt will cont strengthening and HEP at Mod I level and f/u 1x over next 2-3 weeks at which time remaining goals will be assessed and anticipate d/c from therapy.    OT Occupational Profile and History Problem Focused Assessment - Including review of records relating to presenting problem    Occupational performance deficits (Please refer to evaluation for details): ADL's;Work    Body Structure / Function / Physical Skills ADL;Strength;Dexterity;Pain;Edema;UE functional use;ROM;Scar mobility;Coordination;Flexibility;Mobility;Decreased knowledge of precautions;[facility]    Rehab Potential Good    Clinical Decision Making Limited treatment options, no task modification necessary    Comorbidities Affecting Occupational Performance: May have comorbidities impacting occupational performance    Modification or Assistance to Complete Evaluation  No modification of tasks or assist necessary to complete eval    OT Frequency Other (comment)   Eval +12 visits   OT Duration Other (comment)    OT Treatment/Interventions Self-care/ADL training;Fluidtherapy;Splinting;Therapeutic activities;Compression bandaging;Ultrasound;Therapeutic exercise;Scar mobilization;Cryotherapy;Passive range of motion;Manual Therapy;Patient/family education    Plan Check remaining LTG's and anticipte d/c to I home program next 1-2 visits.    Consulted and Agree with Plan of Care Patient             Patient will benefit from skilled therapeutic intervention in order to improve the following deficits and impairments:   Body Structure / Function / Physical Skills: ADL, Strength, Dexterity, Pain, Edema, UE functional use, ROM, Scar mobility, Coordination, Flexibility, Mobility, Decreased knowledge of precautions, [facility]       Visit  Diagnosis: Pain in joint of right hand  Pain in right wrist  Other lack of coordination  Generalized edema  Stiffness of right hand, not elsewhere classified  Stiffness of right wrist, not elsewhere classified  Muscle weakness (generalized)    Problem List Patient Active Problem List   Diagnosis Date Noted   Closed fracture of radius, shaft 09/19/2020    Chela Sutphen Dionicio Stall, OT/L 01/02/2021, 4:42 PM  [facility] Physical Therapy [address] [place], [place], [address] Phone: [phone]   Fax:  [phone]  Name: [name] MRN: [id number] Date of Birth: [birth year]

## 2021-01-16 ENCOUNTER — Ambulatory Visit: Payer: 59 | Admitting: Orthopaedic Surgery

## 2021-01-23 ENCOUNTER — Other Ambulatory Visit: Payer: Self-pay

## 2021-01-23 ENCOUNTER — Ambulatory Visit (INDEPENDENT_AMBULATORY_CARE_PROVIDER_SITE_OTHER): Payer: 59 | Admitting: Occupational Therapy

## 2021-01-23 ENCOUNTER — Encounter: Payer: Self-pay | Admitting: Occupational Therapy

## 2021-01-23 DIAGNOSIS — M25541 Pain in joints of right hand: Secondary | ICD-10-CM | POA: Diagnosis not present

## 2021-01-23 DIAGNOSIS — M25641 Stiffness of right hand, not elsewhere classified: Secondary | ICD-10-CM

## 2021-01-23 DIAGNOSIS — M25631 Stiffness of right wrist, not elsewhere classified: Secondary | ICD-10-CM

## 2021-01-23 DIAGNOSIS — R601 Generalized edema: Secondary | ICD-10-CM | POA: Diagnosis not present

## 2021-01-23 DIAGNOSIS — M25531 Pain in right wrist: Secondary | ICD-10-CM

## 2021-01-23 DIAGNOSIS — M6281 Muscle weakness (generalized): Secondary | ICD-10-CM

## 2021-01-23 DIAGNOSIS — R278 Other lack of coordination: Secondary | ICD-10-CM | POA: Diagnosis not present

## 2021-01-23 NOTE — Therapy (Signed)
[facility] Physical Therapy [address] [place], [place], [address] Phone: [phone]   Fax:  [phone]  Occupational Therapy Treatment and Discharge Summary  Patient Details  Name: [name] MRN: [id number] Date of Birth: 1980/12/14 Referring Provider (OT): Dr Lorin Mercy   Encounter Date: 01/23/2021   OT End of Session - 01/23/21 0941     Visit Number 10    Number of Visits 13    Authorization Type [place] - No Auth required    Authorization - Visit Number 10    Progress Note Due on Visit 10    OT Start Time 0854    OT Stop Time 0930    OT Time Calculation (min) 36 min    Activity Tolerance Patient tolerated treatment well    Behavior During Therapy [facility] for tasks assessed/performed             Past Medical History:  Diagnosis Date   Anxiety    Hypertension     Past Surgical History:  Procedure Laterality Date   CESAREAN SECTION     right acl reconstruction      There were no vitals filed for this visit.   Subjective Assessment - 01/23/21 0858     Subjective  Pt reports stiffness in right wrist with end range extension.    Pertinent History HTN, Anxiety    Patient Stated Goals Increased A/ROM, strength, "Get hand and wrist back"    Currently in Pain? No/denies    Pain Score 0-No pain    Multiple Pain Sites No                [facility] OT Assessment - 01/23/21 0001       Strength   Right/Left Wrist Right    Right Hand Grip (lbs) 39.5   34.1# first attempt                OT Treatments/Exercises (OP) - 01/23/21 0001       ADLs   ADL Comments Pt reports doing all ADL's and homemaking activity at this time. She reports that she uses her right for everything except 1 work related activty where she has to change client lenses in thier glasses. Pt was educated in pinching and gripping activity that should assist her in accomplishing this.   No limitations or restrictions at this time R UE. Pt agreeable to d/c to  I HEP at this time.     Exercises   Exercises Wrist;Hand      Wrist Exercises   Other wrist exercises Performed weighted wrist stretch using 5# weight for right wrist flexion while supported on table and extension with forearm supinated on table x10 reps each. Used blue hand gripper/digi lex R hand for thumb/grip strengthening x5 min. Focus on thumb use for strengthing in prep for removing lenses from glasses at work. Reviewed wall stretches & prayer stretch for wrist extension and hammer PRN for forearm pronation/supination. Pro/sup is WNL's at this time as observed in clinic today.    Other wrist exercises Adjusted putty to include positoning for thumb and thenar emminence motion/strengthening. Demo in clinic. Pt was also instructed in using her gripper at home and adjusting for increased thumb motion/flexibility and strengthening. She verbalized understanding after demo in clinic today.      Hand Exercises   Other Hand Exercises UBE x8 min rotating forward and back for hand and wrist flexibility and A/ROM increase to level 3.0 for generalized strengthening and A/ROM bilateral UE's/wrist  Other Hand Exercises Wrist flexion with 5# weight over edge of table, 5# for wrist extension and reviewed 16oz hammer for forearm supination and pronation at home.      Manual Therapy   Edema Management Pt is Mod i edema control techniques R UE, wearing glove PRN.                 OT Education - 01/23/21 0940     Education Details Cont HEP w/o limitations, adjusted grip and pinch activity for increased thumb use/strengthening and flexibility. D/C from therapy and cont HEP I'ly R UE.    Person(s) Educated Patient    Methods Explanation;Demonstration    Comprehension Verbalized understanding;Returned demonstration              OT Short Term Goals - 11/21/20 1054       OT SHORT TERM GOAL #1   Title Pt to be Mod I initial HEP right wrist    Time 6    Period Weeks    Status Achieved     Target Date 12/19/20      OT SHORT TERM GOAL #2   Title Pt will be Mod I scar management and desensitization techniques R forearm/ORIFvolar scar    Time 6    Period Weeks    Status Achieved    Target Date 12/19/20      OT SHORT TERM GOAL #3   Title Pt will be Mod I edema control techniques and edema glove use as seen in clinic Right hand    Time 6    Period Weeks    Status Achieved    Target Date 12/19/20               OT Long Term Goals - 01/23/21 0922       OT LONG TERM GOAL #1   Title Pt will be Mod I upgraded HEP R hand/wrist and will be Mod I strengthening    Time 12    Period Weeks    Status Achieved    Target Date 01/30/21      OT LONG TERM GOAL #2   Title Pt will demonstrate increased grip as seen by ability to flex fingers into palm w/ <1cm to [facility] right hand    Time 12    Period Weeks    Status Achieved    Target Date 01/30/21      OT LONG TERM GOAL #3   Title Pt will report decreased pain with functional activity right hand as 3/10 or less during work related tasks    Time 12    Period Weeks    Status Achieved    Target Date 01/30/21      OT LONG TERM GOAL #4   Title Pt will demonstrate functional grip strength right hand as 15# or greater via JAMAR dynamometer    Time 12    Period Weeks    Status Achieved   34.1 and 39.5 R on 01/23/21   Target Date 01/30/21                Plan - 01/23/21 0942     Clinical Impression Statement Pt is independent in her HEP at this time and has met all STG/LTG's for R UE. She should benefeit from continuing HEP I'ly for R UE/wrist. She is independent with all ADL's and homemaking tasks per her report. Pt denies pain but reports difficulty removing client lenses from glasses at work. Her HEP was adjusted today to  encourage increased ROM/flexibility and strengthening to the thumb so that she may return to work related activity of removing/replacing client lenses at work.  Pt was d/c from OT/hand therapy at this  time, will sign off.    OT Occupational Profile and History Problem Focused Assessment - Including review of records relating to presenting problem    Occupational performance deficits (Please refer to evaluation for details): ADL's;Work    Body Structure / Function / Physical Skills ADL;Strength;Dexterity;Pain;Edema;UE functional use;ROM;Scar mobility;Coordination;Flexibility;Mobility;Decreased knowledge of precautions;FMC    Rehab Potential Good    Clinical Decision Making Limited treatment options, no task modification necessary    Comorbidities Affecting Occupational Performance: May have comorbidities impacting occupational performance    Modification or Assistance to Complete Evaluation  No modification of tasks or assist necessary to complete eval    OT Frequency Other (comment)    OT Duration Other (comment)    OT Treatment/Interventions Self-care/ADL training;Fluidtherapy;Splinting;Therapeutic activities;Compression bandaging;Ultrasound;Therapeutic exercise;Scar mobilization;Cryotherapy;Passive range of motion;Manual Therapy;Patient/family education    Plan D/C to independent HEP at this time, sign off OT.    Consulted and Agree with Plan of Care Patient             Patient will benefit from skilled therapeutic intervention in order to improve the following deficits and impairments:   Body Structure / Function / Physical Skills: ADL, Strength, Dexterity, Pain, Edema, UE functional use, ROM, Scar mobility, Coordination, Flexibility, Mobility, Decreased knowledge of precautions, [facility]       Visit Diagnosis: Pain in joint of right hand  Pain in right wrist  Other lack of coordination  Generalized edema  Stiffness of right hand, not elsewhere classified  Stiffness of right wrist, not elsewhere classified  Muscle weakness (generalized)    Problem List Patient Active Problem List   Diagnosis Date Noted   Closed fracture of radius, shaft 09/19/2020  OCCUPATIONAL THERAPY  DISCHARGE SUMMARY  Visits from Start of Care: 10  Current functional level related to goals / functional outcomes: Pt has met 3/3 STG's and 4/4 LTG's. She will continue with HEP and strengthening independently.   Remaining deficits: Pt has met all goals but reports difficulty when removing lenses from client glasses at work. HEP was adjusted today to focus on increased thumb ROM/strength/flexibility to make this task easier for pt.   Education / Equipment: Cont .HEP and strengthening as discussed in clinic.   Patient agrees to discharge. Patient goals were met. Patient is being discharged due to meeting the stated rehab goals.Carlynn Herald, [place], [place] 01/23/2021, 10:00 AM  [facility] Physical Therapy [address] [place], [place], [address] Phone: [phone]   Fax:  [phone]  Name: TOYOKO SILOS MRN: [id number] Date of Birth: Jul 06, 1980

## 2021-05-04 ENCOUNTER — Telehealth: Payer: Self-pay | Admitting: Orthopaedic Surgery

## 2021-05-04 NOTE — Telephone Encounter (Signed)
Error

## 2021-06-07 NOTE — H&P (Signed)
41 year old female with history of 2 c sections presents for nexplanon removal and D and C, Hysteroscopy and HTA and [facility] BTL. ?No complaints  ?History of 2 week long heavy and painful periods ?She and husband share 5 children ? ?Past Medical History:  ?Diagnosis Date  ? Anxiety   ? Hypertension   ? ?Past Surgical History:  ?Procedure Laterality Date  ? CESAREAN SECTION    ? right acl reconstruction    ? ?Prior to Admission medications   ?Medication Sig Start Date End Date Taking? Authorizing Provider  ?acetaminophen (TYLENOL) 500 MG tablet Take 500 mg by mouth every 6 (six) hours as needed for moderate pain.    [provider]  ?amLODipine (NORVASC) 10 MG tablet Take 10 mg by mouth daily.  12/30/16   [provider]  ?Cholecalciferol (VITAMIN D) 125 MCG (5000 UT) CAPS Take 5,000 Units by mouth daily.    [provider]  ?etonogestrel (NEXPLANON) 68 MG IMPL implant 1 each by Subdermal route once. 02-2020    [provider]  ?ibuprofen (ADVIL) 800 MG tablet Take 1 tablet (800 mg total) by mouth every 12 (twelve) hours as needed. 09/19/20   Marybelle Killings, MD  ?ibuprofen (ADVIL,MOTRIN) 200 MG tablet Take 800 mg by mouth every 6 (six) hours as needed for moderate pain.    [provider]  ?olmesartan-hydrochlorothiazide (BENICAR HCT) 40-25 MG tablet Take 1 tablet by mouth daily. 08/23/20   [provider]  ?oxyCODONE-acetaminophen (PERCOCET/ROXICET) 5-325 MG tablet Take 1-2 tablets by mouth every 4 (four) hours as needed for severe pain. ?Patient not taking: Reported on 10/31/2020 10/03/20   Marybelle Killings, MD  ?lisinopril (PRINIVIL,ZESTRIL) 10 MG tablet Take 1 tablet (10 mg total) by mouth daily. ?Patient not taking: Reported on 01/04/2014 11/08/11 01/04/14  Hazel Sams, PA-C  ? ?Allergies NKDA ? ?General Alert and oriented ?Lung CTAB  ?Car RRR ?Abdomen is soft and non tender ? ?IMPRESSION: ?Desires nexplanon removal and permanent sterilization ? ?PLAN: ?Nexplanon  removal ?[place] BTL ?Hysteroscopy, D and C, HTA ?

## 2021-06-22 ENCOUNTER — Ambulatory Visit (HOSPITAL_BASED_OUTPATIENT_CLINIC_OR_DEPARTMENT_OTHER): Admission: RE | Admit: 2021-06-22 | Payer: 59 | Source: Home / Self Care | Admitting: Obstetrics and Gynecology

## 2021-06-22 ENCOUNTER — Encounter (HOSPITAL_BASED_OUTPATIENT_CLINIC_OR_DEPARTMENT_OTHER): Admission: RE | Payer: Self-pay | Source: Home / Self Care

## 2021-06-22 DIAGNOSIS — N920 Excessive and frequent menstruation with regular cycle: Secondary | ICD-10-CM

## 2021-06-22 SURGERY — LIGATION, FALLOPIAN TUBE, LAPAROSCOPIC
Anesthesia: Choice

## 2022-01-07 ENCOUNTER — Encounter (HOSPITAL_BASED_OUTPATIENT_CLINIC_OR_DEPARTMENT_OTHER): Payer: Self-pay | Admitting: Obstetrics and Gynecology

## 2022-01-08 ENCOUNTER — Encounter (HOSPITAL_BASED_OUTPATIENT_CLINIC_OR_DEPARTMENT_OTHER): Payer: Self-pay | Admitting: Obstetrics and Gynecology

## 2022-01-08 NOTE — Anesthesia Preprocedure Evaluation (Signed)
Anesthesia Evaluation  Patient identified by MRN, date of birth, ID band Patient awake    Reviewed: Allergy & Precautions, NPO status , Patient's Chart, lab work & pertinent test results  Airway Mallampati: II  TM Distance: >3 FB Neck ROM: Full    Dental no notable dental hx.    Pulmonary neg pulmonary ROS   Pulmonary exam normal        Cardiovascular hypertension, Pt. on medications and Pt. on home beta blockers  Rhythm:Regular Rate:Normal     Neuro/Psych   Anxiety     negative neurological ROS     GI/Hepatic negative GI ROS, Neg liver ROS,,,  Endo/Other  diabetes, Type 2, Oral Hypoglycemic Agents    Renal/GU negative Renal ROS  negative genitourinary   Musculoskeletal negative musculoskeletal ROS (+)    Abdominal Normal abdominal exam  (+)   Peds  Hematology negative hematology ROS (+)   Anesthesia Other Findings   Reproductive/Obstetrics                             Anesthesia Physical Anesthesia Plan  ASA: 2  Anesthesia Plan: General   Post-op Pain Management: Celebrex PO (pre-op)* and Tylenol PO (pre-op)*   Induction: Intravenous  PONV Risk Score and Plan: 3 and Ondansetron, Dexamethasone, Midazolam, Treatment may vary due to age or medical condition and Scopolamine patch - Pre-op  Airway Management Planned: Mask and Oral ETT  Additional Equipment: None  Intra-op Plan:   Post-operative Plan: Extubation in OR  Informed Consent: I have reviewed the patients History and Physical, chart, labs and discussed the procedure including the risks, benefits and alternatives for the proposed anesthesia with the patient or authorized representative who has indicated his/her understanding and acceptance.     Dental advisory given  Plan Discussed with: CRNA  Anesthesia Plan Comments: (Lab Results      Component                Value               Date                       PREGTESTUR               NEGATIVE            01/09/2022            Lab Results      Component                Value               Date                      WBC                      5.0                 01/09/2022                HGB                      14.1                01/09/2022                HCT  43.7                01/09/2022                MCV                      80.8                01/09/2022                PLT                      368                 01/09/2022           )       Anesthesia Quick Evaluation

## 2022-01-08 NOTE — Progress Notes (Signed)
Spoke w/ via phone for pre-op interview--- pt Lab needs dos----  cbc, bmp, t&s, urine preg, ekg             Lab results------ no COVID test -----patient states asymptomatic no test needed Arrive at ------- 0530 on 01-09-2022 NPO after MN NO Solid Food.  Clear liquids from MN until--- 0430 Med rec completed Medications to take morning of surgery ----- norvasc Diabetic medication -----  do not take metformin morning of surgery  Patient instructed no nail polish to be worn day of surgery Patient instructed to bring photo id and insurance card day of surgery Patient aware to have Driver (ride ) / caregiver  for 24 hours after surgery --- husband, [name] Patient Special Instructions -----  pt stated does mounjaro on Sunday's , last dose 01-06-2022.  Pt stated she was not given instructions from office to stop week prior to surgery Pre-Op special Istructions ----- n/a Patient verbalized understanding of instructions that were given at this phone interview. Patient denies shortness of breath, chest pain, fever, cough at this phone interview.

## 2022-01-09 ENCOUNTER — Ambulatory Visit (HOSPITAL_BASED_OUTPATIENT_CLINIC_OR_DEPARTMENT_OTHER): Payer: 59 | Admitting: Anesthesiology

## 2022-01-09 ENCOUNTER — Encounter (HOSPITAL_BASED_OUTPATIENT_CLINIC_OR_DEPARTMENT_OTHER): Payer: Self-pay | Admitting: Obstetrics and Gynecology

## 2022-01-09 ENCOUNTER — Encounter (HOSPITAL_BASED_OUTPATIENT_CLINIC_OR_DEPARTMENT_OTHER)
Admission: RE | Disposition: A | Discharge status: Home / Self Care | Payer: Self-pay | Source: Home / Self Care | Attending: Obstetrics and Gynecology

## 2022-01-09 ENCOUNTER — Ambulatory Visit (HOSPITAL_BASED_OUTPATIENT_CLINIC_OR_DEPARTMENT_OTHER)
Admission: RE | Admit: 2022-01-09 | Discharge: 2022-01-09 | Disposition: A | Discharge status: Home / Self Care | Payer: 59 | Attending: Obstetrics and Gynecology | Admitting: Obstetrics and Gynecology

## 2022-01-09 ENCOUNTER — Other Ambulatory Visit: Payer: Self-pay

## 2022-01-09 DIAGNOSIS — Z302 Encounter for sterilization: Secondary | ICD-10-CM | POA: Insufficient documentation

## 2022-01-09 DIAGNOSIS — I10 Essential (primary) hypertension: Secondary | ICD-10-CM | POA: Insufficient documentation

## 2022-01-09 DIAGNOSIS — N920 Excessive and frequent menstruation with regular cycle: Secondary | ICD-10-CM | POA: Insufficient documentation

## 2022-01-09 DIAGNOSIS — E119 Type 2 diabetes mellitus without complications: Secondary | ICD-10-CM | POA: Diagnosis not present

## 2022-01-09 DIAGNOSIS — K660 Peritoneal adhesions (postprocedural) (postinfection): Secondary | ICD-10-CM | POA: Diagnosis not present

## 2022-01-09 DIAGNOSIS — Z01818 Encounter for other preprocedural examination: Secondary | ICD-10-CM

## 2022-01-09 HISTORY — PX: REMOVAL OF NON VAGINAL CONTRACEPTIVE DEVICE: SHX6219

## 2022-01-09 HISTORY — PX: LAPAROSCOPIC TUBAL LIGATION: SHX1937

## 2022-01-09 HISTORY — PX: DILITATION & CURRETTAGE/HYSTROSCOPY WITH HYDROTHERMAL ABLATION: SHX5570

## 2022-01-09 HISTORY — DX: Type 2 diabetes mellitus without complications: E11.9

## 2022-01-09 HISTORY — PX: LAPAROSCOPIC LYSIS OF ADHESIONS: SHX5905

## 2022-01-09 HISTORY — DX: Excessive and frequent menstruation with regular cycle: N92.0

## 2022-01-09 HISTORY — DX: Presence of spectacles and contact lenses: Z97.3

## 2022-01-09 LAB — BASIC METABOLIC PANEL
Anion gap: 9 (ref 5–15)
BUN: 8 mg/dL (ref 6–20)
CO2: 26 mmol/L (ref 22–32)
Calcium: 9.3 mg/dL (ref 8.9–10.3)
Chloride: 105 mmol/L (ref 98–111)
Creatinine, Ser: 0.77 mg/dL (ref 0.44–1.00)
GFR, Estimated: >60 mL/min (ref >60)
Glucose, Bld: 108 mg/dL — ABNORMAL HIGH (ref 70–99)
Potassium: 3.7 mmol/L (ref 3.5–5.1)
Sodium: 140 mmol/L (ref 135–145)

## 2022-01-09 LAB — GLUCOSE, CAPILLARY: Glucose-Capillary: 116 mg/dL — ABNORMAL HIGH (ref 70–99)

## 2022-01-09 LAB — ABO/RH: ABO/RH(D): O POS

## 2022-01-09 LAB — CBC
HCT: 43.7 % (ref 36.0–46.0)
Hemoglobin: 14.1 g/dL (ref 12.0–15.0)
MCH: 26.1 pg (ref 26.0–34.0)
MCHC: 32.3 g/dL (ref 30.0–36.0)
MCV: 80.8 fL (ref 80.0–100.0)
Platelets: 368 10*3/uL (ref 150–400)
RBC: 5.41 MIL/uL — ABNORMAL HIGH (ref 3.87–5.11)
RDW: 13.9 % (ref 11.5–15.5)
WBC: 5 10*3/uL (ref 4.0–10.5)
nRBC: 0 % (ref 0.0–0.2)

## 2022-01-09 LAB — TYPE AND SCREEN
ABO/RH(D): O POS
Antibody Screen: NEGATIVE

## 2022-01-09 LAB — POCT PREGNANCY, URINE: Preg Test, Ur: NEGATIVE

## 2022-01-09 SURGERY — LIGATION, FALLOPIAN TUBE, LAPAROSCOPIC
Anesthesia: General | Site: Uterus

## 2022-01-09 MED ORDER — SODIUM CHLORIDE 0.9 % IV SOLN
INTRAVENOUS | Status: AC
  Filled 2022-01-09: qty 2

## 2022-01-09 MED ORDER — CELECOXIB 200 MG PO CAPS
ORAL_CAPSULE | ORAL | Status: AC
  Filled 2022-01-09: qty 1

## 2022-01-09 MED ORDER — CELECOXIB 200 MG PO CAPS
200.0000 mg | ORAL_CAPSULE | Freq: Once | ORAL | Status: AC
  Administered 2022-01-09: 200 mg via ORAL

## 2022-01-09 MED ORDER — SODIUM CHLORIDE 0.9 % IV SOLN
2.0000 g | INTRAVENOUS | Status: AC
  Administered 2022-01-09: 2 g via INTRAVENOUS

## 2022-01-09 MED ORDER — PHENYLEPHRINE 80 MCG/ML (10ML) SYRINGE FOR IV PUSH (FOR BLOOD PRESSURE SUPPORT)
PREFILLED_SYRINGE | INTRAVENOUS | Status: DC | PRN
  Administered 2022-01-09: 80 ug via INTRAVENOUS
  Administered 2022-01-09: 160 ug via INTRAVENOUS

## 2022-01-09 MED ORDER — DEXAMETHASONE SODIUM PHOSPHATE 10 MG/ML IJ SOLN
INTRAMUSCULAR | Status: DC | PRN
  Administered 2022-01-09: 10 mg via INTRAVENOUS

## 2022-01-09 MED ORDER — OXYCODONE HCL 5 MG/5ML PO SOLN: 5.0000 mg | Freq: Once | ORAL | Status: AC | PRN

## 2022-01-09 MED ORDER — LIDOCAINE HCL 1 % IJ SOLN
INTRAMUSCULAR | Status: DC | PRN
  Administered 2022-01-09: 10 mL

## 2022-01-09 MED ORDER — LIDOCAINE 2% (20 MG/ML) 5 ML SYRINGE
INTRAMUSCULAR | Status: DC | PRN
  Administered 2022-01-09: 100 mg via INTRAVENOUS

## 2022-01-09 MED ORDER — OXYCODONE HCL 5 MG PO TABS
ORAL_TABLET | ORAL | Status: AC
  Filled 2022-01-09: qty 1

## 2022-01-09 MED ORDER — FENTANYL CITRATE (PF) 100 MCG/2ML IJ SOLN
25.0000 ug | INTRAMUSCULAR | Status: DC | PRN
  Administered 2022-01-09 (×3): 25 ug via INTRAVENOUS

## 2022-01-09 MED ORDER — MIDAZOLAM HCL 2 MG/2ML IJ SOLN
INTRAMUSCULAR | Status: AC
  Filled 2022-01-09: qty 2

## 2022-01-09 MED ORDER — WHITE PETROLATUM EX OINT
TOPICAL_OINTMENT | CUTANEOUS | Status: AC
  Filled 2022-01-09: qty 5

## 2022-01-09 MED ORDER — 0.9 % SODIUM CHLORIDE (POUR BTL) OPTIME
TOPICAL | Status: DC | PRN
  Administered 2022-01-09: 500 mL

## 2022-01-09 MED ORDER — PROPOFOL 10 MG/ML IV BOLUS
INTRAVENOUS | Status: AC
  Filled 2022-01-09: qty 20

## 2022-01-09 MED ORDER — FENTANYL CITRATE (PF) 250 MCG/5ML IJ SOLN
INTRAMUSCULAR | Status: DC | PRN
  Administered 2022-01-09 (×2): 50 ug via INTRAVENOUS
  Administered 2022-01-09 (×2): 25 ug via INTRAVENOUS
  Administered 2022-01-09: 50 ug via INTRAVENOUS

## 2022-01-09 MED ORDER — SCOPOLAMINE 1 MG/3DAYS TD PT72
1.0000 | MEDICATED_PATCH | TRANSDERMAL | Status: DC
  Administered 2022-01-09: 1.5 mg via TRANSDERMAL

## 2022-01-09 MED ORDER — ACETAMINOPHEN 500 MG PO TABS
ORAL_TABLET | ORAL | Status: AC
  Filled 2022-01-09: qty 2

## 2022-01-09 MED ORDER — FENTANYL CITRATE (PF) 250 MCG/5ML IJ SOLN
INTRAMUSCULAR | Status: AC
  Filled 2022-01-09: qty 5

## 2022-01-09 MED ORDER — SODIUM CHLORIDE 0.9 % IR SOLN
Status: DC | PRN
  Administered 2022-01-09: 3000 mL

## 2022-01-09 MED ORDER — PROPOFOL 10 MG/ML IV BOLUS
INTRAVENOUS | Status: DC | PRN
  Administered 2022-01-09: 200 mg via INTRAVENOUS

## 2022-01-09 MED ORDER — SCOPOLAMINE 1 MG/3DAYS TD PT72
MEDICATED_PATCH | TRANSDERMAL | Status: AC
  Filled 2022-01-09: qty 1

## 2022-01-09 MED ORDER — DIPHENHYDRAMINE HCL 50 MG/ML IJ SOLN
INTRAMUSCULAR | Status: DC | PRN
  Administered 2022-01-09: 12.5 mg via INTRAVENOUS

## 2022-01-09 MED ORDER — BUPIVACAINE HCL (PF) 0.25 % IJ SOLN
INTRAMUSCULAR | Status: DC | PRN
  Administered 2022-01-09: 9 mL

## 2022-01-09 MED ORDER — MIDAZOLAM HCL 2 MG/2ML IJ SOLN
INTRAMUSCULAR | Status: DC | PRN
  Administered 2022-01-09: 2 mg via INTRAVENOUS

## 2022-01-09 MED ORDER — ONDANSETRON HCL 4 MG/2ML IJ SOLN
INTRAMUSCULAR | Status: DC | PRN
  Administered 2022-01-09: 4 mg via INTRAVENOUS

## 2022-01-09 MED ORDER — ROCURONIUM BROMIDE 10 MG/ML (PF) SYRINGE
PREFILLED_SYRINGE | INTRAVENOUS | Status: DC | PRN
  Administered 2022-01-09: 60 mg via INTRAVENOUS

## 2022-01-09 MED ORDER — LACTATED RINGERS IV SOLN: INTRAVENOUS | Status: DC

## 2022-01-09 MED ORDER — FENTANYL CITRATE (PF) 100 MCG/2ML IJ SOLN
INTRAMUSCULAR | Status: AC
  Filled 2022-01-09: qty 2

## 2022-01-09 MED ORDER — OXYCODONE HCL 5 MG PO TABS: 5.0000 mg | ORAL_TABLET | ORAL | 0 refills | Status: AC | PRN

## 2022-01-09 MED ORDER — OXYCODONE HCL 5 MG PO TABS
5.0000 mg | ORAL_TABLET | Freq: Once | ORAL | Status: AC | PRN
  Administered 2022-01-09: 5 mg via ORAL

## 2022-01-09 MED ORDER — POVIDONE-IODINE 10 % EX SWAB: 2.0000 | Freq: Once | CUTANEOUS | Status: DC

## 2022-01-09 MED ORDER — ACETAMINOPHEN 500 MG PO TABS
1000.0000 mg | ORAL_TABLET | Freq: Once | ORAL | Status: AC
  Administered 2022-01-09: 1000 mg via ORAL

## 2022-01-09 MED ORDER — IBUPROFEN 200 MG PO TABS: 800.0000 mg | ORAL_TABLET | Freq: Three times a day (TID) | ORAL | 0 refills | Status: AC

## 2022-01-09 SURGICAL SUPPLY — 45 items
ADH SKN CLS APL DERMABOND .7 (GAUZE/BANDAGES/DRESSINGS) ×8
BLADE SURG 11 STRL SS (BLADE) IMPLANT
BNDG CMPR 5X3 CHSV STRCH STRL (GAUZE/BANDAGES/DRESSINGS) ×4
BNDG COHESIVE 3X5 TAN ST LF (GAUZE/BANDAGES/DRESSINGS) IMPLANT
CABLE HIGH FREQUENCY MONO STRZ (ELECTRODE) IMPLANT
COVER MAYO STAND STRL (DRAPES) IMPLANT
DERMABOND ADVANCED .7 DNX12 (GAUZE/BANDAGES/DRESSINGS) IMPLANT
DRSG COVADERM PLUS 2X2 (GAUZE/BANDAGES/DRESSINGS) IMPLANT
DURAPREP 26ML APPLICATOR (WOUND CARE) ×4 IMPLANT
ELECT REM PT RETURN 9FT ADLT (ELECTROSURGICAL) ×4
ELECTRODE REM PT RTRN 9FT ADLT (ELECTROSURGICAL) ×4 IMPLANT
GLOVE BIO SURGEON STRL SZ 6.5 (GLOVE) ×4 IMPLANT
GLOVE BIOGEL PI IND STRL 7.0 (GLOVE) IMPLANT
GLOVE SURG SS PI 7.0 STRL IVOR (GLOVE) IMPLANT
GOWN STRL REUS W/ TWL LRG LVL3 (GOWN DISPOSABLE) IMPLANT
GOWN STRL REUS W/TWL LRG LVL3 (GOWN DISPOSABLE) ×8 IMPLANT
IV NS IRRIG 3000ML ARTHROMATIC (IV SOLUTION) IMPLANT
KIT TURNOVER CYSTO (KITS) ×4 IMPLANT
NDL HYPO 25X1 1.5 SAFETY (NEEDLE) IMPLANT
NDL INSUFFLATION 14GA 120MM (NEEDLE) ×4 IMPLANT
NEEDLE HYPO 22GX1.5 SAFETY (NEEDLE) IMPLANT
NEEDLE HYPO 25X1 1.5 SAFETY (NEEDLE) IMPLANT
NEEDLE INSUFFLATION 14GA 120MM (NEEDLE) ×4 IMPLANT
NS IRRIG 500ML POUR BTL (IV SOLUTION) ×4 IMPLANT
PACK LAPAROSCOPY BASIN (CUSTOM PROCEDURE TRAY) ×4 IMPLANT
PACK TRENDGUARD 450 HYBRID PRO (MISCELLANEOUS) IMPLANT
PAD OB MATERNITY 4.3X12.25 (PERSONAL CARE ITEMS) ×4 IMPLANT
SEALER TISSUE G2 CVD JAW 45CM (ENDOMECHANICALS) IMPLANT
SET GENESYS HTA PROCERVA (MISCELLANEOUS) IMPLANT
SET SUCTION IRRIG HYDROSURG (IRRIGATION / IRRIGATOR) IMPLANT
SET TUBE SMOKE EVAC HIGH FLOW (TUBING) ×4 IMPLANT
SPONGE GAUZE 2X2 8PLY STRL LF (GAUZE/BANDAGES/DRESSINGS) IMPLANT
STRIP CLOSURE SKIN 1/2X4 (GAUZE/BANDAGES/DRESSINGS) IMPLANT
STRIP CLOSURE SKIN 1/4X4 (GAUZE/BANDAGES/DRESSINGS) IMPLANT
SUT MNCRL AB 3-0 PS2 18 (SUTURE) ×4 IMPLANT
SUT VIC AB 3-0 FS2 27 (SUTURE) IMPLANT
SUT VIC AB 3-0 PS2 18 (SUTURE)
SUT VIC AB 3-0 PS2 18XBRD (SUTURE) IMPLANT
SUT VICRYL 0 UR6 27IN ABS (SUTURE) ×4 IMPLANT
TOWEL OR 17X26 10 PK STRL BLUE (TOWEL DISPOSABLE) ×8 IMPLANT
TRAY FOLEY W/BAG SLVR 14FR LF (SET/KITS/TRAYS/PACK) ×4 IMPLANT
TRENDGUARD 450 HYBRID PRO PACK (MISCELLANEOUS) ×4
TROCAR Z-THREAD BLADED 11X100M (TROCAR) ×4 IMPLANT
TROCAR Z-THREAD BLADED 5X100MM (TROCAR) IMPLANT
WARMER LAPAROSCOPE (MISCELLANEOUS) ×4 IMPLANT

## 2022-01-09 NOTE — Op Note (Signed)
[name], [name] MEDICAL RECORD NO: [id number] ACCOUNT NO: [id number] DATE OF BIRTH: 1980-12-20 FACILITY: [facility] LOCATION: [facility] PHYSICIAN: Shannie Kontos L. Vincente Poli, MD  Operative Report   DATE OF PROCEDURE: 01/09/2022  PREOPERATIVE DIAGNOSES:  Excessive menstruation and desires permanent sterilization.  POSTOPERATIVE :  Excessive menstruation, desires permanent sterilization and omental adhesions.    PROCEDURE:   1.  Nexplanon removal. 2. Laparoscopic bilateral tubal ligation with release of omental adhesion. 3.  Hysteroscopy. 4.  D and C. 5.  Hydrothermal endometrial ablation.  COMPLICATIONS:  None.  DRAINS: None.  ANESTHESIA: Local and general.  PATHOLOGY: Uterine curettings.  DESCRIPTION OF PROCEDURE:  The patient was taken to the operating room.  She was intubated.  She was prepped and draped.  A timeout was performed.  We then proceeded with the Nexplanon removal.  Her left inner arm had been marked where the Nexplanon rod  was palpated.  She was prepped at the area and local was infiltrated.  A small incision was made and the Nexplanon rod was removed and noted to be intact.  Steri-Strips were applied and the arm was wrapped.  We then proceeded with prepping the abdomen  and vagina in the standard fashion.  A Foley catheter was inserted and removed at the end of the case.  A uterine manipulator was inserted.  Attention was turned to the abdomen where a small infraumbilical incision was made.  The Veress needle was  inserted.  The CO2 was used for insufflation.  The Veress needle was removed and an 11 mm trocar was inserted and the abdominal cavity appeared to be normal.  The patient was placed in Trendelenburg position.  Uterus was small. Right adnexal area was  normal.  There was a small omental adhesion from the omentum to the anterior portion of the left side of the uterus. In order to visualize the tubes, I did take that adhesion down easily with good hemostasis.  We  then used Kleppingers. We did have to put  in the secondary site in order to take the adhesions down, so I did put in a secondary trocar under direct visualization.  We then used Kleppingers and placed that across each fallopian tube and performed a bilateral tubal ligation with a triple burn  technique.  Hemostasis was very good.  All instruments were removed from the abdominal cavity and the incision at the umbilicus was closed with 0 Vicryl and both incisions were closed with Dermabond.  The attention was turned to the abdomen where the  uterine manipulator was removed.  The cervix was grasped with a tenaculum and a paracervical block was performed.  The cervical internal os was gently dilated and a sharp curette was inserted and the uterus was thoroughly curetted of all tissue.  The  hysteroscope was inserted and with excellent visualization, I could see that we had proper placement of the hysteroscope. A fluid check was performed and it was 0 mL.  We then performed a 10 minute HTA without any incident.  At the end of the procedure,  all instruments were removed from the vagina.  All sponge, lap and instrument counts were correct x2.  The patient went to recovery room in stable condition.  She will be discharged home from there and she will follow up with [place] in the usual postop visit  in approximately 2 weeks.       PAA D: 01/09/2022 8:59:32 am T: 01/09/2022 9:30:00 am  JOB: [id number]/ [id number]

## 2022-01-09 NOTE — Anesthesia Procedure Notes (Signed)
Procedure Name: Intubation Date/Time: 01/09/2022 7:35 AM  Performed by: Clearnce Sorrel, CRNAPre-anesthesia Checklist: Patient identified, Emergency Drugs available, Suction available and Patient being monitored Patient Re-evaluated:Patient Re-evaluated prior to induction Oxygen Delivery Method: Circle System Utilized Preoxygenation: Pre-oxygenation with 100% oxygen Induction Type: IV induction Ventilation: Mask ventilation without difficulty Laryngoscope Size: Mac and 3 Grade View: Grade I Tube type: Oral Tube size: 7.0 mm Number of attempts: 1 Airway Equipment and Method: Stylet and Oral airway Placement Confirmation: ETT inserted through vocal cords under direct vision, positive ETCO2 and breath sounds checked- equal and bilateral Secured at: 22 cm Tube secured with: Tape Dental Injury: Teeth and Oropharynx as per pre-operative assessment

## 2022-01-09 NOTE — Brief Op Note (Signed)
01/09/2022  8:50 AM  PATIENT:  [name]  41 y.o. female  PRE-OPERATIVE DIAGNOSIS:  Excessive menstruation Desires permanent sterilization  POST-OPERATIVE DIAGNOSIS:  Same Omental adhesion  PROCEDURE:  Procedure(s): LAPAROSCOPIC BILATERAL TUBAL LIGATION WITH CAUTERY (Bilateral) NEXPLANON REMOVAL (Left) LAPAROSCOPIC LYSIS OF ADHESIONS (N/A) DILATATION & CURETTAGE/HYSTEROSCOPY WITH HYDROTHERMAL ABLATION (N/A)  SURGEON:  Surgeon(s) and Role:    * Marcelle Overlie, MD - Primary  PHYSICIAN ASSISTANT:   ASSISTANTS: none   ANESTHESIA:   local and general  EBL:  25 mL   BLOOD ADMINISTERED:none  DRAINS: none   LOCAL MEDICATIONS USED:  MARCAINE    and LIDOCAINE   SPECIMEN:  Source of Specimen:  uterine curretings  DISPOSITION OF SPECIMEN:  PATHOLOGY  COUNTS:  YES  TOURNIQUET:  * No tourniquets in log *  DICTATION: .Other Dictation: Dictation Number dictated  PLAN OF CARE: Discharge to home after PACU  PATIENT DISPOSITION:  PACU - hemodynamically stable.   Delay start of Pharmacological VTE agent (>24hrs) due to surgical blood loss or risk of bleeding: not applicable

## 2022-01-09 NOTE — H&P (Signed)
41 year old female presents for [facility] BTL, Hysteroscopy, D and C, HTA and nexplanon removal. Past Medical History:  Diagnosis Date   Anxiety    Excessive menstruation    Hypertension    Type 2 diabetes mellitus (HCC)    followed by pcp   (01-08-2022  per pt does not check blood sugar)   Wears contact lenses    Past Surgical History:  Procedure Laterality Date   CESAREAN SECTION     2001;  2003   KNEE ARTHROSCOPY W/ ACL RECONSTRUCTION Right 06/27/2010   @[facility]  by dr   ORIF WRIST FRACTURE Right 09/25/2020    Prior to Admission medications   Medication Sig Start Date End Date Taking? Authorizing Provider  amLODipine (NORVASC) 5 MG tablet Take 5 mg by mouth daily.   Yes [provider]  carvedilol (COREG) 6.25 MG tablet Take 6.25 mg by mouth 2 (two) times daily with a meal.   Yes [provider]  Cholecalciferol (VITAMIN D) 125 MCG (5000 UT) CAPS Take 5,000 Units by mouth daily.   Yes [provider]  metFORMIN (GLUCOPHAGE) 500 MG tablet Take 500 mg by mouth 2 (two) times daily with a meal.   Yes [provider]  olmesartan-hydrochlorothiazide (BENICAR HCT) 40-25 MG tablet Take 1 tablet by mouth daily. 08/23/20  Yes [provider]  tirzepatide 08/25/20) 5 MG/0.5ML Pen Inject 5 mg into the skin once a week. Sunday's   Yes [provider]  acetaminophen (TYLENOL) 500 MG tablet Take 500 mg by mouth every 6 (six) hours as needed for moderate pain.    [provider]  etonogestrel (NEXPLANON) 68 MG IMPL implant 1 each by Subdermal route once. 04-14-1986    [provider]  ibuprofen (ADVIL,MOTRIN) 200 MG tablet Take 800 mg by mouth every 6 (six) hours as needed for moderate pain.    [provider]  lisinopril (PRINIVIL,ZESTRIL) 10 MG tablet Take 1 tablet (10 mg total) by mouth daily. Patient not taking: Reported on 01/04/2014 11/08/11 01/04/14  01/06/14, PA-C   Family History  Problem Relation Age of Onset    Hypertension Mother    Diabetes Father    Hypertension Father    Mental illness Father    Cancer Maternal Grandmother    Diabetes Maternal Grandmother    Hypertension Maternal Grandmother    Stroke Paternal Grandmother    Social History   Socioeconomic History   Marital status: Significant Other    Spouse name: Not on file   Number of children: Not on file   Years of education: Not on file   Highest education level: Not on file  Occupational History   Not on file  Tobacco Use   Smoking status: Never   Smokeless tobacco: Never  Vaping Use   Vaping Use: Never used  Substance and Sexual Activity   Alcohol use: No    Alcohol/week: 0.0 standard drinks of alcohol   Drug use: Never   Sexual activity: Yes    Birth control/protection: Implant  Other Topics Concern   Not on file  Social History Narrative   Not on file   Social Determinants of Health   Financial Resource Strain: Not on file  Food Insecurity: Not on file  Transportation Needs: Not on file  Physical Activity: Not on file  Stress: Not on file  Social Connections: Not on file   Shellfish allergy  SUBJECTIVE: General alert and oriented Lung CTAB Car RRR Abdomen is soft and non tender  Pelvic EGBUS WNL Vagina WNL Cervix no lesions Uterus is non tender Adnexae non tender  IMPRESSION: Desires permanent sterilization Excessive menstruation  PLAN: [facility] BTL Hysteroscopy  D and C HTA Nexplanon removal

## 2022-01-09 NOTE — Transfer of Care (Signed)
Immediate Anesthesia Transfer of Care Note  Patient: [name]  Procedure(s) Performed: LAPAROSCOPIC BILATERAL TUBAL LIGATION WITH CAUTERY (Bilateral: Abdomen) NEXPLANON REMOVAL (Left: Arm Upper) LAPAROSCOPIC LYSIS OF ADHESIONS (Abdomen) DILATATION & CURETTAGE/HYSTEROSCOPY WITH HYDROTHERMAL ABLATION (Uterus)  Patient Location: PACU  Anesthesia Type:General  Level of Consciousness: drowsy  Airway & Oxygen Therapy: Patient Spontanous Breathing  Post-op Assessment: Report given to RN and Post -op Vital signs reviewed and stable  Post vital signs: Reviewed and stable  Last Vitals:  Vitals Value Taken Time  BP 143/104 01/09/22 0901  Temp 36.4 C 01/09/22 0900  Pulse 91 01/09/22 0904  Resp 11 01/09/22 0904  SpO2 100 % 01/09/22 0904  Vitals shown include unvalidated device data.  Last Pain:  Vitals:   01/09/22 0607  TempSrc: Oral  PainSc: 0-No pain      Patients Stated Pain Goal: 3 (01/09/22 2353)  Complications: No notable events documented.

## 2022-01-09 NOTE — Discharge Instructions (Signed)
No acetaminophen/Tylenol until after 1:00 pm today if needed.No ibuprofen, Advil, Aleve, Motrin, ketorolac, meloxicam, naproxen, or other NSAIDS until after 1:00 pm today if needed.     Post Anesthesia Home Care Instructions  Activity: Get plenty of rest for the remainder of the day. A responsible individual must stay with you for 24 hours following the procedure.  For the next 24 hours, DO NOT: -Drive a car -Advertising copywriter -Drink alcoholic beverages -Take any medication unless instructed by your physician -Make any legal decisions or sign important papers.  Meals: Start with liquid foods such as gelatin or soup. Progress to regular foods as tolerated. Avoid greasy, spicy, heavy foods. If nausea and/or vomiting occur, drink only clear liquids until the nausea and/or vomiting subsides. Call your physician if vomiting continues.  Special Instructions/Symptoms: Your throat may feel dry or sore from the anesthesia or the breathing tube placed in your throat during surgery. If this causes discomfort, gargle with warm salt water. The discomfort should disappear within 24 hours.  If you had a scopolamine patch placed behind your ear for the management of post- operative nausea and/or vomiting:  1. The medication in the patch is effective for 72 hours, after which it should be removed.  Wrap patch in a tissue and discard in the trash. Wash hands thoroughly with soap and water. 2. You may remove the patch earlier than 72 hours if you experience unpleasant side effects which may include dry mouth, dizziness or visual disturbances. 3. Avoid touching the patch. Wash your hands with soap and water after contact with the patch.

## 2022-01-10 ENCOUNTER — Encounter (HOSPITAL_BASED_OUTPATIENT_CLINIC_OR_DEPARTMENT_OTHER): Payer: Self-pay | Admitting: Obstetrics and Gynecology

## 2022-01-10 LAB — SURGICAL PATHOLOGY

## 2022-01-10 NOTE — Anesthesia Postprocedure Evaluation (Signed)
Anesthesia Post Note  Patient: [name]  Procedure(s) Performed: LAPAROSCOPIC BILATERAL TUBAL LIGATION WITH CAUTERY (Bilateral: Abdomen) NEXPLANON REMOVAL (Left: Arm Upper) LAPAROSCOPIC LYSIS OF ADHESIONS (Abdomen) DILATATION & CURETTAGE/HYSTEROSCOPY WITH HYDROTHERMAL ABLATION (Uterus)     Patient location during evaluation: PACU Anesthesia Type: General Level of consciousness: awake and alert Pain management: pain level controlled Vital Signs Assessment: post-procedure vital signs reviewed and stable Respiratory status: spontaneous breathing, nonlabored ventilation, respiratory function stable and patient connected to nasal cannula oxygen Cardiovascular status: blood pressure returned to baseline and stable Postop Assessment: no apparent nausea or vomiting Anesthetic complications: no   No notable events documented.  Last Vitals:  Vitals:   01/09/22 1000 01/09/22 1030  BP: (!) 151/114 (!) 143/90  Pulse: 86 81  Resp: 16 16  Temp:  37.1 C  SpO2: 100% 99%    Last Pain:  Vitals:   01/10/22 1009  TempSrc:   PainSc: 2                  Nelle Don Shanti Agresti

## 2022-03-09 IMAGING — DX DG WRIST COMPLETE 3+V*R*
4 series · 4 of 4 positions shown · non-contrast
Comparison: None.

CLINICAL DATA: Radial fracture, recent fall

EXAM:
RIGHT WRIST - COMPLETE 3+ VIEW

[wrist ap (1 of 2)]
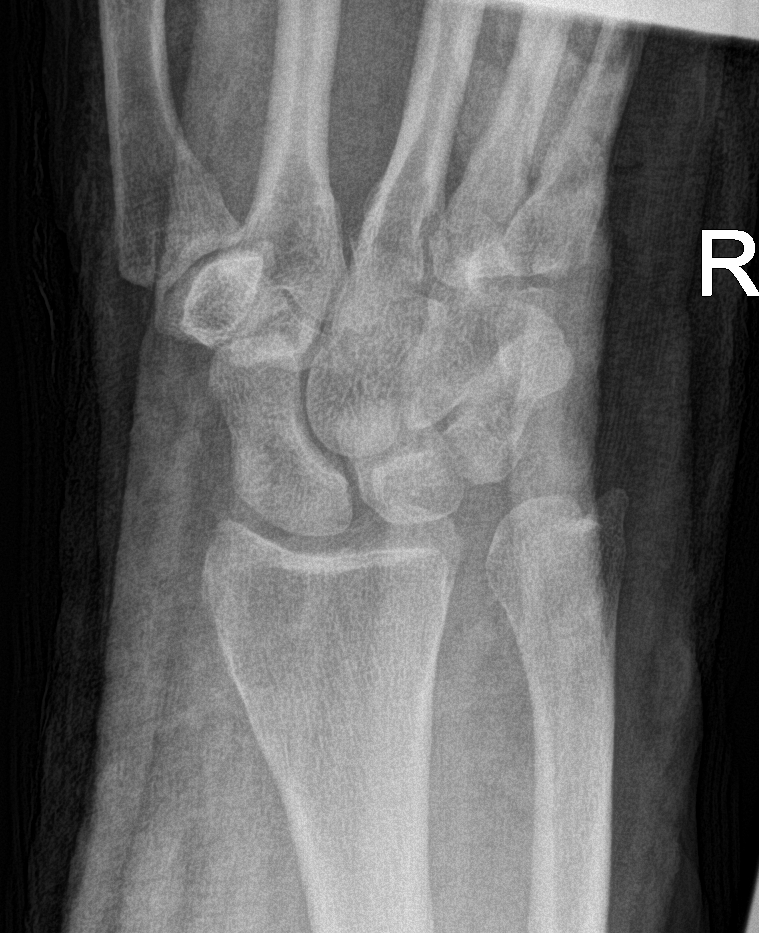

[wrist obl]
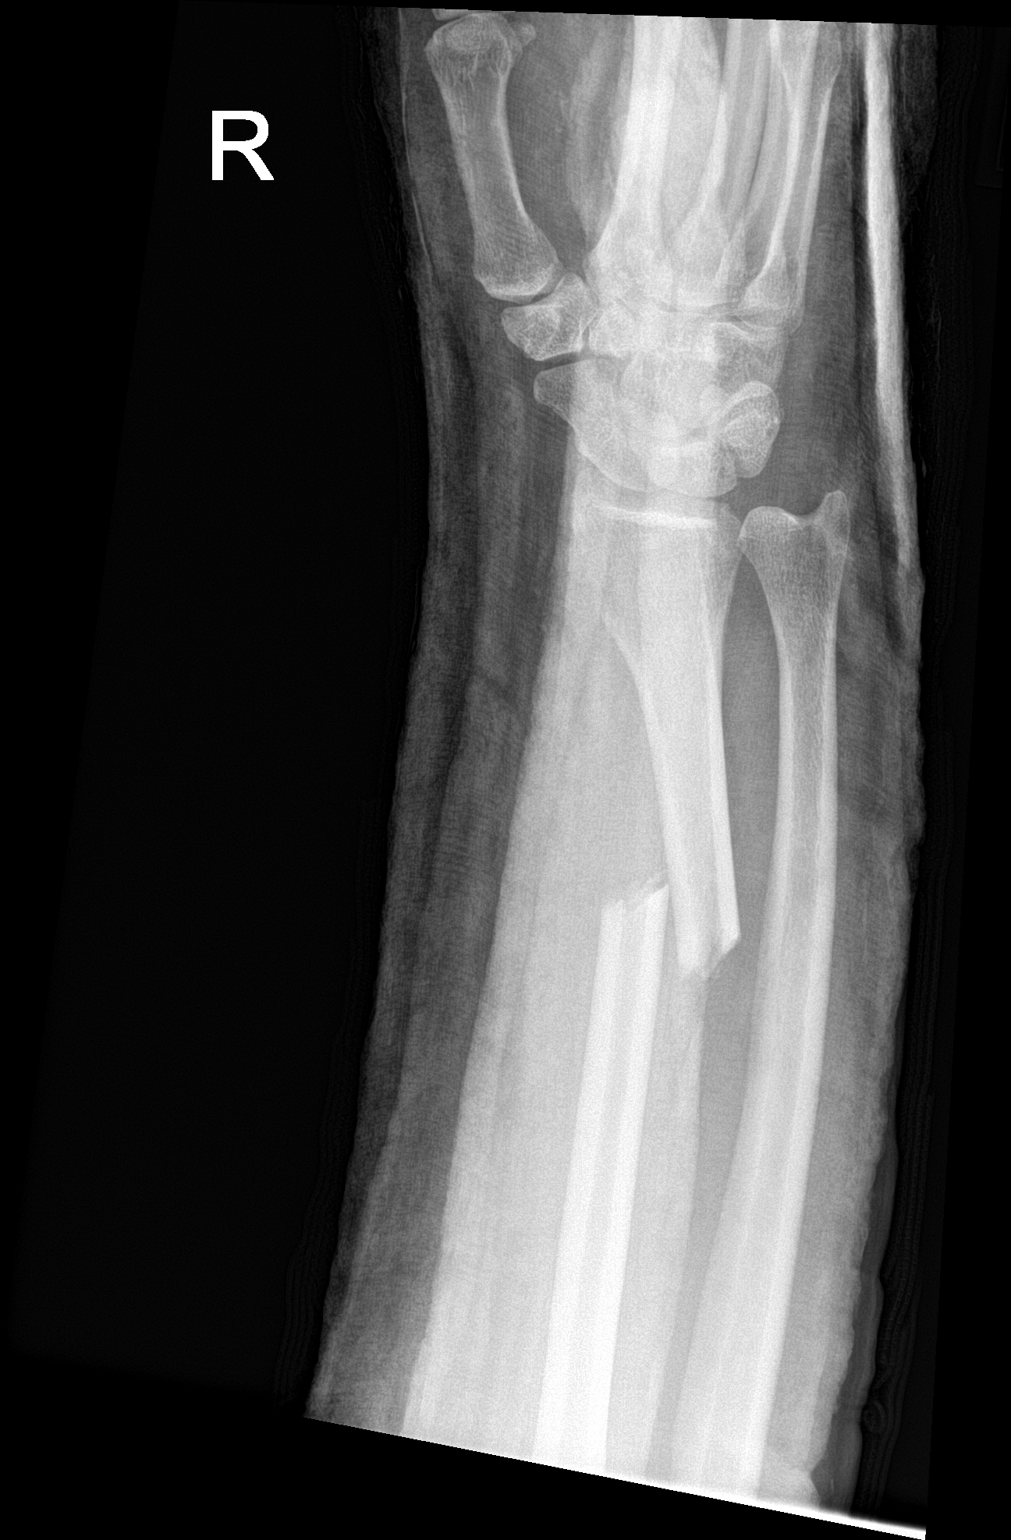

[wrist lat]
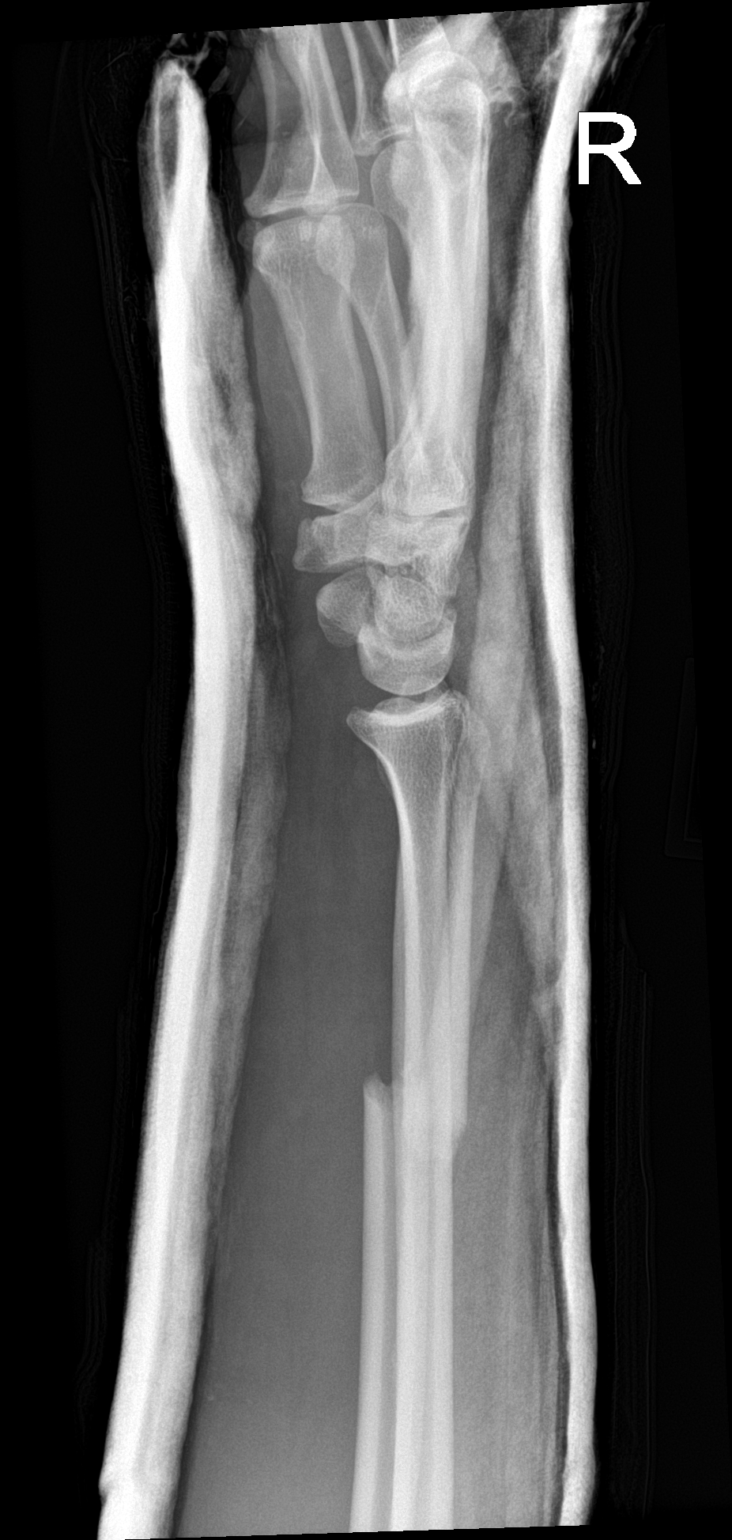

[wrist ap (2 of 2)]
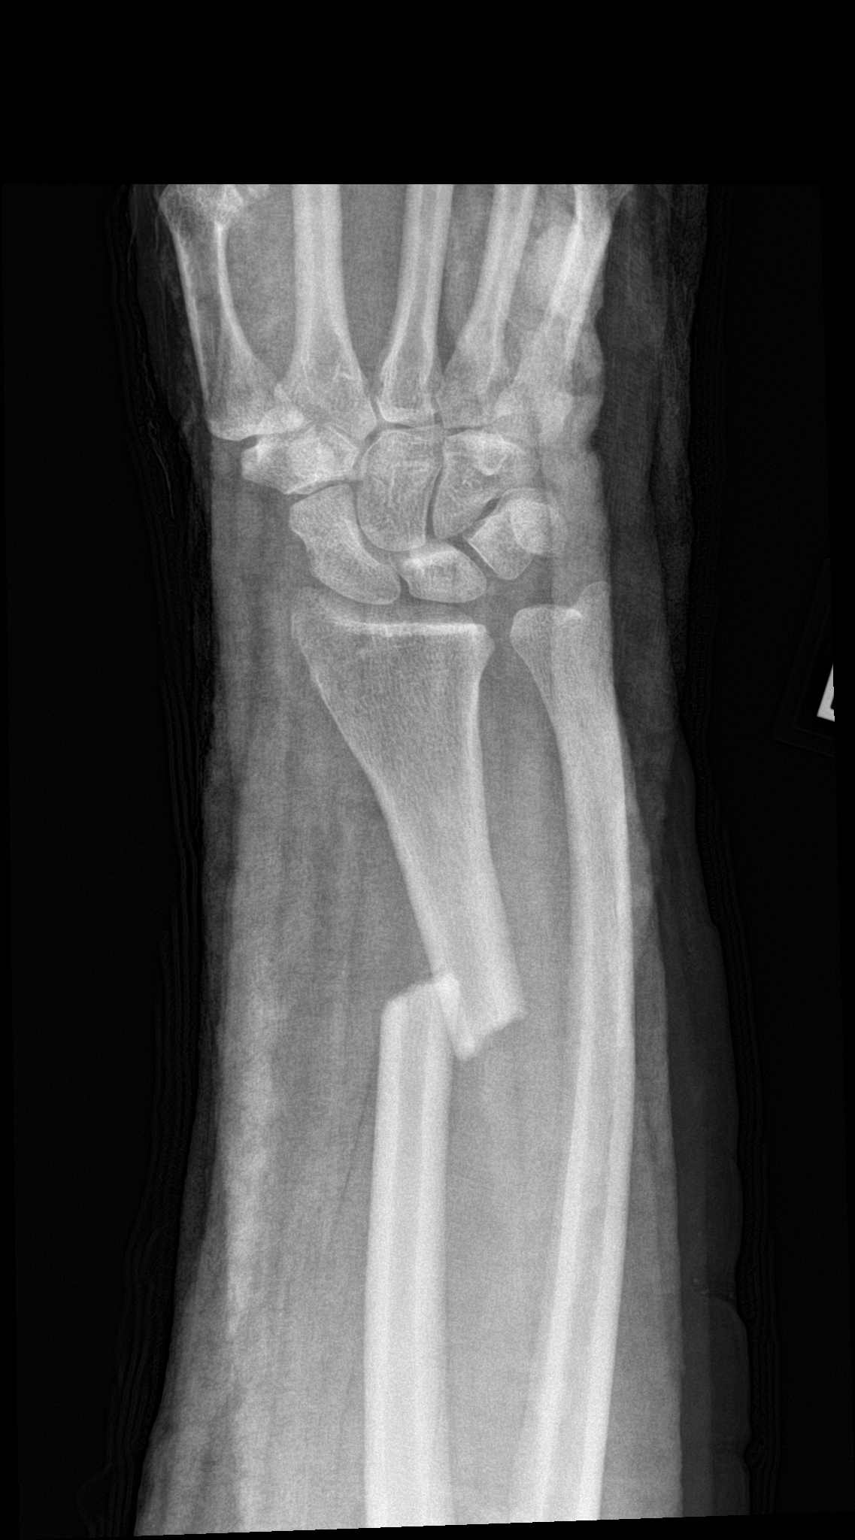

[4 of 4 positions shown; findings below may reference images not displayed]

FINDINGS: Acute oblique displaced fracture of the right distal radius
diaphysis. Distal fragment is displaced dorsally on the lateral view
with an overriding component as well. Visualize ulna and carpal
bones intact. Overlying cast material noted.
IMPRESSION: Acute oblique and displaced fracture of the right distal radius
diaphysis.

## 2022-11-25 DIAGNOSIS — K5900 Constipation, unspecified: Secondary | ICD-10-CM | POA: Diagnosis not present

## 2022-11-25 DIAGNOSIS — Z7185 Encounter for immunization safety counseling: Secondary | ICD-10-CM | POA: Diagnosis not present

## 2022-11-25 DIAGNOSIS — M25522 Pain in left elbow: Secondary | ICD-10-CM | POA: Diagnosis not present

## 2022-11-25 DIAGNOSIS — I10 Essential (primary) hypertension: Secondary | ICD-10-CM | POA: Diagnosis not present

## 2022-11-26 DIAGNOSIS — M7989 Other specified soft tissue disorders: Secondary | ICD-10-CM | POA: Diagnosis not present

## 2022-12-25 ENCOUNTER — Other Ambulatory Visit: Payer: Self-pay

## 2022-12-25 ENCOUNTER — Encounter: Payer: Self-pay | Admitting: Physical Therapy

## 2022-12-25 ENCOUNTER — Ambulatory Visit: Payer: BC Managed Care – PPO | Attending: Family Medicine | Admitting: Physical Therapy

## 2022-12-25 DIAGNOSIS — R29898 Other symptoms and signs involving the musculoskeletal system: Secondary | ICD-10-CM | POA: Diagnosis not present

## 2022-12-25 DIAGNOSIS — M6281 Muscle weakness (generalized): Secondary | ICD-10-CM | POA: Insufficient documentation

## 2022-12-25 DIAGNOSIS — M5459 Other low back pain: Secondary | ICD-10-CM | POA: Insufficient documentation
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
## 2022-12-30 DIAGNOSIS — M545 Low back pain, unspecified: Secondary | ICD-10-CM | POA: Diagnosis not present

## 2022-12-30 DIAGNOSIS — M25522 Pain in left elbow: Secondary | ICD-10-CM | POA: Diagnosis not present

## 2022-12-30 DIAGNOSIS — G43909 Migraine, unspecified, not intractable, without status migrainosus: Secondary | ICD-10-CM | POA: Diagnosis not present

## 2022-12-30 DIAGNOSIS — I10 Essential (primary) hypertension: Secondary | ICD-10-CM | POA: Diagnosis not present

## 2023-01-13 ENCOUNTER — Encounter: Payer: Self-pay | Admitting: Physical Therapy

## 2023-01-13 ENCOUNTER — Ambulatory Visit: Payer: BC Managed Care – PPO | Attending: Family Medicine | Admitting: Physical Therapy

## 2023-01-13 DIAGNOSIS — R29898 Other symptoms and signs involving the musculoskeletal system: Secondary | ICD-10-CM | POA: Diagnosis not present

## 2023-01-13 DIAGNOSIS — M6281 Muscle weakness (generalized): Secondary | ICD-10-CM | POA: Insufficient documentation

## 2023-01-13 DIAGNOSIS — M5459 Other low back pain: Secondary | ICD-10-CM | POA: Diagnosis not present
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
## 2023-01-20 ENCOUNTER — Ambulatory Visit: Payer: BC Managed Care – PPO | Admitting: Physical Therapy

## 2023-01-20 ENCOUNTER — Encounter: Payer: Self-pay | Admitting: Physical Therapy

## 2023-01-20 DIAGNOSIS — M5459 Other low back pain: Secondary | ICD-10-CM | POA: Diagnosis not present

## 2023-01-20 DIAGNOSIS — M6281 Muscle weakness (generalized): Secondary | ICD-10-CM | POA: Diagnosis not present

## 2023-01-20 DIAGNOSIS — R29898 Other symptoms and signs involving the musculoskeletal system: Secondary | ICD-10-CM | POA: Diagnosis not present
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
## 2023-01-27 ENCOUNTER — Ambulatory Visit: Payer: BC Managed Care – PPO | Admitting: Physical Therapy

## 2023-02-07 DIAGNOSIS — Z13220 Encounter for screening for lipoid disorders: Secondary | ICD-10-CM | POA: Diagnosis not present

## 2023-02-07 DIAGNOSIS — Z0000 Encounter for general adult medical examination without abnormal findings: Secondary | ICD-10-CM | POA: Diagnosis not present

## 2023-02-07 DIAGNOSIS — R739 Hyperglycemia, unspecified: Secondary | ICD-10-CM | POA: Diagnosis not present

## 2023-02-07 DIAGNOSIS — Z114 Encounter for screening for human immunodeficiency virus [HIV]: Secondary | ICD-10-CM | POA: Diagnosis not present

## 2023-02-11 ENCOUNTER — Other Ambulatory Visit: Payer: Self-pay | Admitting: Family Medicine

## 2023-02-11 DIAGNOSIS — Z1231 Encounter for screening mammogram for malignant neoplasm of breast: Secondary | ICD-10-CM

## 2023-02-11 DIAGNOSIS — Z0000 Encounter for general adult medical examination without abnormal findings: Secondary | ICD-10-CM | POA: Diagnosis not present

## 2023-02-17 DIAGNOSIS — E11319 Type 2 diabetes mellitus with unspecified diabetic retinopathy without macular edema: Secondary | ICD-10-CM | POA: Diagnosis not present

## 2023-02-17 DIAGNOSIS — E1165 Type 2 diabetes mellitus with hyperglycemia: Secondary | ICD-10-CM | POA: Diagnosis not present

## 2023-02-17 DIAGNOSIS — I10 Essential (primary) hypertension: Secondary | ICD-10-CM | POA: Diagnosis not present

## 2023-03-07 ENCOUNTER — Ambulatory Visit
Admission: RE | Admit: 2023-03-07 | Discharge: 2023-03-07 | Disposition: A | Discharge status: Home / Self Care | Payer: BC Managed Care – PPO | Source: Ambulatory Visit | Attending: Family Medicine | Admitting: Family Medicine

## 2023-03-07 DIAGNOSIS — Z1231 Encounter for screening mammogram for malignant neoplasm of breast: Secondary | ICD-10-CM | POA: Diagnosis not present

## 2023-03-10 DIAGNOSIS — M545 Low back pain, unspecified: Secondary | ICD-10-CM | POA: Diagnosis not present

## 2023-03-10 DIAGNOSIS — I10 Essential (primary) hypertension: Secondary | ICD-10-CM | POA: Diagnosis not present

## 2023-03-10 DIAGNOSIS — M5416 Radiculopathy, lumbar region: Secondary | ICD-10-CM | POA: Diagnosis not present

## 2023-03-10 DIAGNOSIS — G43109 Migraine with aura, not intractable, without status migrainosus: Secondary | ICD-10-CM | POA: Diagnosis not present

## 2023-04-07 DIAGNOSIS — I10 Essential (primary) hypertension: Secondary | ICD-10-CM | POA: Diagnosis not present

## 2023-04-07 DIAGNOSIS — E669 Obesity, unspecified: Secondary | ICD-10-CM | POA: Diagnosis not present

## 2023-04-07 DIAGNOSIS — G43909 Migraine, unspecified, not intractable, without status migrainosus: Secondary | ICD-10-CM | POA: Diagnosis not present

## 2023-05-12 DIAGNOSIS — G43109 Migraine with aura, not intractable, without status migrainosus: Secondary | ICD-10-CM | POA: Diagnosis not present

## 2023-05-12 DIAGNOSIS — I10 Essential (primary) hypertension: Secondary | ICD-10-CM | POA: Diagnosis not present

## 2023-06-09 DIAGNOSIS — I10 Essential (primary) hypertension: Secondary | ICD-10-CM | POA: Diagnosis not present

## 2023-07-03 DIAGNOSIS — I10 Essential (primary) hypertension: Secondary | ICD-10-CM | POA: Diagnosis not present

## 2023-08-06 DIAGNOSIS — I10 Essential (primary) hypertension: Secondary | ICD-10-CM | POA: Diagnosis not present

## 2023-08-25 DIAGNOSIS — H33321 Round hole, right eye: Secondary | ICD-10-CM | POA: Diagnosis not present

## 2023-08-25 DIAGNOSIS — H5319 Other subjective visual disturbances: Secondary | ICD-10-CM | POA: Diagnosis not present

## 2023-08-25 DIAGNOSIS — H43391 Other vitreous opacities, right eye: Secondary | ICD-10-CM | POA: Diagnosis not present

## 2023-08-25 DIAGNOSIS — H3561 Retinal hemorrhage, right eye: Secondary | ICD-10-CM | POA: Diagnosis not present

## 2023-09-02 DIAGNOSIS — G43109 Migraine with aura, not intractable, without status migrainosus: Secondary | ICD-10-CM | POA: Diagnosis not present

## 2023-09-02 DIAGNOSIS — G43909 Migraine, unspecified, not intractable, without status migrainosus: Secondary | ICD-10-CM | POA: Diagnosis not present

## 2023-09-02 DIAGNOSIS — I10 Essential (primary) hypertension: Secondary | ICD-10-CM | POA: Diagnosis not present

## 2023-09-02 DIAGNOSIS — H33329 Round hole, unspecified eye: Secondary | ICD-10-CM | POA: Diagnosis not present

## 2023-09-04 DIAGNOSIS — H3589 Other specified retinal disorders: Secondary | ICD-10-CM | POA: Diagnosis not present

## 2023-09-04 DIAGNOSIS — H31091 Other chorioretinal scars, right eye: Secondary | ICD-10-CM | POA: Diagnosis not present

## 2023-09-04 DIAGNOSIS — H33321 Round hole, right eye: Secondary | ICD-10-CM | POA: Diagnosis not present

## 2023-09-04 DIAGNOSIS — H43391 Other vitreous opacities, right eye: Secondary | ICD-10-CM | POA: Diagnosis not present

## 2023-09-04 DIAGNOSIS — H43813 Vitreous degeneration, bilateral: Secondary | ICD-10-CM | POA: Diagnosis not present

## 2023-10-02 DIAGNOSIS — H31091 Other chorioretinal scars, right eye: Secondary | ICD-10-CM | POA: Diagnosis not present

## 2023-10-02 DIAGNOSIS — H3589 Other specified retinal disorders: Secondary | ICD-10-CM | POA: Diagnosis not present

## 2023-10-02 DIAGNOSIS — H43813 Vitreous degeneration, bilateral: Secondary | ICD-10-CM | POA: Diagnosis not present

## 2023-10-02 DIAGNOSIS — H43391 Other vitreous opacities, right eye: Secondary | ICD-10-CM | POA: Diagnosis not present

## 2023-10-07 DIAGNOSIS — I10 Essential (primary) hypertension: Secondary | ICD-10-CM | POA: Diagnosis not present

## 2023-10-07 DIAGNOSIS — Z7185 Encounter for immunization safety counseling: Secondary | ICD-10-CM | POA: Diagnosis not present

## 2023-11-03 DIAGNOSIS — H43391 Other vitreous opacities, right eye: Secondary | ICD-10-CM | POA: Diagnosis not present

## 2023-11-03 DIAGNOSIS — H31091 Other chorioretinal scars, right eye: Secondary | ICD-10-CM | POA: Diagnosis not present

## 2023-11-03 DIAGNOSIS — H33331 Multiple defects of retina without detachment, right eye: Secondary | ICD-10-CM | POA: Diagnosis not present

## 2023-11-03 DIAGNOSIS — H43813 Vitreous degeneration, bilateral: Secondary | ICD-10-CM | POA: Diagnosis not present

## 2023-11-03 DIAGNOSIS — H3589 Other specified retinal disorders: Secondary | ICD-10-CM | POA: Diagnosis not present

## 2023-11-05 DIAGNOSIS — I10 Essential (primary) hypertension: Secondary | ICD-10-CM | POA: Diagnosis not present

## 2023-11-05 DIAGNOSIS — H33329 Round hole, unspecified eye: Secondary | ICD-10-CM | POA: Diagnosis not present

## 2023-11-05 DIAGNOSIS — G43909 Migraine, unspecified, not intractable, without status migrainosus: Secondary | ICD-10-CM | POA: Diagnosis not present

## 2023-11-12 ENCOUNTER — Encounter: Payer: Self-pay | Admitting: *Deleted

## 2023-11-12 NOTE — Progress Notes (Signed)
 [name]                                          MRN: [id number]   11/12/2023   The [facility] Quality Team Specialist reviewed this patient medical record for the purposes of chart review for care gap closure. The following were reviewed: chart review for care gap closure-kidney health evaluation for diabetes:eGFR  and uACR.    [facility] Quality Team

## 2023-12-03 DIAGNOSIS — K5900 Constipation, unspecified: Secondary | ICD-10-CM | POA: Diagnosis not present

## 2023-12-03 DIAGNOSIS — I10 Essential (primary) hypertension: Secondary | ICD-10-CM | POA: Diagnosis not present

## 2024-01-22 DIAGNOSIS — N760 Acute vaginitis: Secondary | ICD-10-CM | POA: Diagnosis not present

## 2024-01-22 DIAGNOSIS — I10 Essential (primary) hypertension: Secondary | ICD-10-CM | POA: Diagnosis not present

## 2024-01-22 DIAGNOSIS — N3000 Acute cystitis without hematuria: Secondary | ICD-10-CM | POA: Diagnosis not present

## 2024-01-22 DIAGNOSIS — B9689 Other specified bacterial agents as the cause of diseases classified elsewhere: Secondary | ICD-10-CM | POA: Diagnosis not present

## 2024-01-22 DIAGNOSIS — N390 Urinary tract infection, site not specified: Secondary | ICD-10-CM | POA: Diagnosis not present
